# Patient Record
Sex: Female | Born: 1995 | Race: White | Hispanic: No | Marital: Single | Smoking: Never smoker
Health system: Southern US, Community
[De-identification: ages and names within clinical notes are randomized; demographics above are authoritative.]

## PROBLEM LIST (undated history)

## (undated) DIAGNOSIS — R011 Cardiac murmur, unspecified: Secondary | ICD-10-CM

## (undated) DIAGNOSIS — M419 Scoliosis, unspecified: Secondary | ICD-10-CM

## (undated) DIAGNOSIS — R9431 Abnormal electrocardiogram [ECG] [EKG]: Secondary | ICD-10-CM

## (undated) DIAGNOSIS — D649 Anemia, unspecified: Secondary | ICD-10-CM

## (undated) DIAGNOSIS — E282 Polycystic ovarian syndrome: Secondary | ICD-10-CM

## (undated) DIAGNOSIS — I4510 Unspecified right bundle-branch block: Secondary | ICD-10-CM

## (undated) HISTORY — PX: ESOPHAGUS SURGERY: SHX626

## (undated) HISTORY — DX: Unspecified right bundle-branch block: I45.10

---
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
## 2018-01-21 DIAGNOSIS — M419 Scoliosis, unspecified: Secondary | ICD-10-CM | POA: Diagnosis not present

## 2018-01-21 DIAGNOSIS — R102 Pelvic and perineal pain: Secondary | ICD-10-CM | POA: Diagnosis not present

## 2018-01-21 DIAGNOSIS — I4510 Unspecified right bundle-branch block: Secondary | ICD-10-CM | POA: Diagnosis not present

## 2018-01-21 DIAGNOSIS — B9689 Other specified bacterial agents as the cause of diseases classified elsewhere: Secondary | ICD-10-CM | POA: Diagnosis not present

## 2018-01-21 DIAGNOSIS — N912 Amenorrhea, unspecified: Secondary | ICD-10-CM | POA: Diagnosis not present

## 2018-01-21 DIAGNOSIS — N838 Other noninflammatory disorders of ovary, fallopian tube and broad ligament: Secondary | ICD-10-CM | POA: Diagnosis not present

## 2018-01-21 DIAGNOSIS — N760 Acute vaginitis: Secondary | ICD-10-CM | POA: Diagnosis not present

## 2018-01-21 DIAGNOSIS — R109 Unspecified abdominal pain: Secondary | ICD-10-CM | POA: Diagnosis not present

## 2018-03-25 ENCOUNTER — Encounter: Payer: Self-pay | Admitting: Emergency Medicine

## 2018-03-25 ENCOUNTER — Other Ambulatory Visit: Payer: Self-pay

## 2018-03-25 ENCOUNTER — Ambulatory Visit
Admission: EM | Admit: 2018-03-25 | Discharge: 2018-03-25 | Disposition: A | Discharge status: Home / Self Care | Payer: Federal, State, Local not specified - PPO | Attending: Family Medicine | Admitting: Family Medicine

## 2018-03-25 DIAGNOSIS — L02411 Cutaneous abscess of right axilla: Secondary | ICD-10-CM | POA: Diagnosis not present

## 2018-03-25 MED ORDER — MUPIROCIN 2 % EX OINT: 1.0000 "application " | TOPICAL_OINTMENT | Freq: Three times a day (TID) | CUTANEOUS | 0 refills | Status: DC

## 2018-03-25 MED ORDER — DOXYCYCLINE HYCLATE 100 MG PO CAPS: 100.0000 mg | ORAL_CAPSULE | Freq: Two times a day (BID) | ORAL | 0 refills | Status: DC

## 2018-03-25 NOTE — ED Triage Notes (Signed)
Patient in today c/o boil in her right arm pit x 5 days.

## 2018-03-25 NOTE — ED Provider Notes (Signed)
[facility]    CSN: [id number] Arrival date & time: 03/25/18  1742     History   Chief Complaint Chief Complaint  Patient presents with  . Recurrent Skin Infections    right underarm    HPI Holly Wu is a 22 y.o. female.   HPI  22 year old female presents with a abscess in her right axilla  for 5 days.  It is not improved and only worsened.  She has had no fever chills.  There is been some mild drainage from the area.  Is in the area that she shaves.         Past Medical History:  Diagnosis Date  . EKG abnormalities   . Scoliosis     There are no active problems to display for this patient.   Past Surgical History:  Procedure Laterality Date  . ESOPHAGUS SURGERY     foreign object removed    OB History   None      Home Medications    Prior to Admission medications   Medication Sig Start Date End Date Taking? Authorizing Provider  doxycycline (VIBRAMYCIN) 100 MG capsule Take 1 capsule (100 mg total) by mouth 2 (two) times daily. 03/25/18   Lutricia Feil, PA-C  mupirocin ointment (BACTROBAN) 2 % Apply 1 application topically 3 (three) times daily. 03/25/18   Lutricia Feil, PA-C    Family History Family History  Problem Relation Age of Onset  . Diabetes Mother   . Healthy Father     Social History Social History   Tobacco Use  . Smoking status: Never Smoker  . Smokeless tobacco: Never Used  Substance Use Topics  . Alcohol use: No  . Drug use: No     Allergies   Patient has no known allergies.   Review of Systems Review of Systems  Constitutional: Positive for activity change. Negative for chills, fatigue and fever.  Skin: Positive for color change and wound.  All other systems reviewed and are negative.    Physical Exam Triage Vital Signs ED Triage Vitals [03/25/18 1757]  Enc Vitals Group     BP 121/87     Pulse Rate 88     Resp 16     Temp 98.3 F (36.8 C)     Temp Source Oral     SpO2 100 %       Weight 153 lb (69.4 kg)     Height  (1.575 m)     Head Circumference      Peak Flow      Pain Score 8     Pain Loc      Pain Edu?      Excl. in GC?    No data found.  Updated Vital Signs BP 121/87 (BP Location: Left Arm)   Pulse 88   Temp 98.3 F (36.8 C) (Oral)   Resp 16   Ht  (1.575 m)   Wt 153 lb (69.4 kg)   LMP 01/19/2018 (Approximate)   SpO2 100%   BMI 27.98 kg/m   Visual Acuity Right Eye Distance:   Left Eye Distance:   Bilateral Distance:    Right Eye Near:   Left Eye Near:    Bilateral Near:     Physical Exam  Constitutional: She is oriented to person, place, and time. She appears well-developed and well-nourished. No distress.  HENT:  Head: Normocephalic.  Eyes: Pupils are equal, round, and reactive to light. Right eye exhibits  no discharge. Left eye exhibits no discharge.  Neck: Normal range of motion.  Musculoskeletal: Normal range of motion.  Neurological: She is alert and oriented to person, place, and time.  Skin: Skin is warm and dry. She is not diaphoretic. There is erythema.  Nation of the right upper arm just distal to the axillary fold shows a abscess pointing from approximately 1 cm with erythema and induration extending to 3 cm.  Punctate opening in the middle of the pointing area easily draining serosanguineous and purulent material with very little effort.  Psychiatric: She has a normal mood and affect. Her behavior is normal. Judgment and thought content normal.  Nursing note and vitals reviewed.    UC Treatments / Results  Labs (all labs ordered are listed, but only abnormal results are displayed) Labs Reviewed - No data to display  EKG None  Radiology No results found.  Procedures Incision and Drainage Date/Time: 03/25/2018 7:04 PM Performed by: Lutricia Feil, PA-C Authorized by: Tommie Sams, DO   Consent:    Consent obtained:  Verbal   Consent given by:  Patient   Risks discussed:  Incomplete drainage,  infection and pain   Alternatives discussed:  Alternative treatment Location:    Type:  Abscess   Size:  3cm   Location:  Upper extremity   Upper extremity location:  Arm   Arm location:  R upper arm Pre-procedure details:    Skin preparation:  Betadine Anesthesia (see MAR for exact dosages):    Anesthesia method:  Local infiltration   Local anesthetic:  Lidocaine 1% WITH epi Procedure type:    Complexity:  Complex Procedure details:    Needle aspiration: no     Incision types:  Single straight   Incision depth:  Subcutaneous   Scalpel blade:  11   Wound management:  Probed and deloculated   Drainage:  Purulent and serosanguinous   Drainage amount:  Moderate   Wound treatment:  Drain placed and wound left open   Packing materials:  1/4 in gauze   Amount 1/4":  2 Post-procedure details:    Patient tolerance of procedure:  Tolerated well, no immediate complications Comments:     Keep dry for 48 hours.  Return to our clinic in 48 hours for wound check possibly removal of drain and reapplication.   (including critical care time)  Medications Ordered in UC Medications - No data to display  Initial Impression / Assessment and Plan / UC Course  I have reviewed the triage vital signs and the nursing notes.  Pertinent labs & imaging results that were available during my care of the patient were reviewed by me and considered in my medical decision making (see chart for details).     Plan: 1. Test/x-ray results and diagnosis reviewed with patient 2. rx as per orders; risks, benefits, potential side effects reviewed with patient 3. Recommend supportive treatment with an area dry follow-up in 2 days for a wound check 4. F/u prn if symptoms worsen or don't improve  Final Clinical Impressions(s) / UC Diagnoses   Final diagnoses:  Cutaneous abscess of right axilla     Discharge Instructions      Keep Area dry for 2 days.  Return to our clinic in 2 days for wound check and  removal of the packing material and if develop high fevers or are not improving  return to the clinic sooner.   ED Prescriptions    Medication Sig Dispense Auth. Provider  mupirocin ointment (BACTROBAN) 2 % Apply 1 application topically 3 (three) times daily. 22 g Ovid Curd P, PA-C   doxycycline (VIBRAMYCIN) 100 MG capsule Take 1 capsule (100 mg total) by mouth 2 (two) times daily. 14 capsule Lutricia Feil, PA-C     Controlled Substance Prescriptions [place] Controlled Substance Registry consulted? Not Applicable   Lutricia Feil, PA-C 03/25/18 6578

## 2018-03-25 NOTE — Discharge Instructions (Signed)
Keep Area dry for 2 days.  Return to our clinic in 2 days for wound check and removal of the packing material and if develop high fevers or are not improving  return to the clinic sooner.

## 2018-03-27 ENCOUNTER — Ambulatory Visit
Admission: EM | Admit: 2018-03-27 | Discharge: 2018-03-27 | Disposition: A | Discharge status: Home / Self Care | Payer: Federal, State, Local not specified - PPO | Attending: Family Medicine | Admitting: Family Medicine

## 2018-03-27 ENCOUNTER — Encounter: Payer: Self-pay | Admitting: Emergency Medicine

## 2018-03-27 ENCOUNTER — Other Ambulatory Visit: Payer: Self-pay

## 2018-03-27 DIAGNOSIS — Z5189 Encounter for other specified aftercare: Secondary | ICD-10-CM

## 2018-03-27 NOTE — ED Provider Notes (Signed)
[facility]  CSN: [id number] Arrival date & time: 03/27/18  1908  History   Chief Complaint Chief Complaint  Patient presents with  . Follow-up    abscess   HPI  22 year old female presents for wound check.  Patient had incision and drainage of an abscess of her right axilla on 5/8.  Wound was packed.  She was placed on doxycycline.  She presents today for packing removal and reassessment.  Patient states that she is markedly improved.  No fevers or chills.  No surrounding redness.  Compliant with antibiotic.  Past Medical History:  Diagnosis Date  . EKG abnormalities   . Scoliosis    Past Surgical History:  Procedure Laterality Date  . ESOPHAGUS SURGERY     foreign object removed    OB History   None      Home Medications    Prior to Admission medications   Medication Sig Start Date End Date Taking? Authorizing Provider  doxycycline (VIBRAMYCIN) 100 MG capsule Take 1 capsule (100 mg total) by mouth 2 (two) times daily. 03/25/18  Yes Lutricia Feil, PA-C  mupirocin ointment (BACTROBAN) 2 % Apply 1 application topically 3 (three) times daily. 03/25/18   Lutricia Feil, PA-C    Family History Family History  Problem Relation Age of Onset  . Diabetes Mother   . Healthy Father     Social History Social History   Tobacco Use  . Smoking status: Never Smoker  . Smokeless tobacco: Never Used  Substance Use Topics  . Alcohol use: No  . Drug use: No    Allergies   Patient has no known allergies.  Review of Systems Review of Systems  Constitutional: Negative.   Skin: Positive for wound.   Physical Exam Triage Vital Signs ED Triage Vitals  Enc Vitals Group     BP 03/27/18 1915 118/80     Pulse Rate 03/27/18 1915 85     Resp 03/27/18 1915 16     Temp 03/27/18 1915 98.6 F (37 C)     Temp Source 03/27/18 1915 Oral     SpO2 03/27/18 1915 100 %     Weight 03/27/18 1916 153 lb (69.4 kg)     Height 03/27/18 1916  (1.575 m)     Head  Circumference --      Peak Flow --      Pain Score 03/27/18 1916 1     Pain Loc --      Pain Edu? --      Excl. in GC? --   Updated Vital Signs BP 118/80 (BP Location: Left Arm)   Pulse 85   Temp 98.6 F (37 C) (Oral)   Resp 16   Ht  (1.575 m)   Wt 153 lb (69.4 kg)   SpO2 100%   BMI 27.98 kg/m   Physical Exam  Constitutional: She appears well-developed. No distress.  Pulmonary/Chest: Effort normal. No respiratory distress.  Skin:  Right axilla - Packing removed. No erythema. No purulence currently.  Nursing note and vitals reviewed.  UC Treatments / Results  Labs (all labs ordered are listed, but only abnormal results are displayed) Labs Reviewed - No data to display  EKG None  Radiology No results found.  Procedures Procedures (including critical care time)  Medications Ordered in UC Medications - No data to display  Initial Impression / Assessment and Plan / UC Course  I have reviewed the triage vital signs and the nursing notes.  Pertinent labs & imaging results that were available during my care of the patient were reviewed by me and considered in my medical decision making (see chart for details).    22 year old female presents for wound check.  Packing removed.  No indication for additional packing.  Advised completion of antibiotic.  Supportive care.  Final Clinical Impressions(s) / UC Diagnoses   Final diagnoses:  Wound check, abscess     Discharge Instructions     Finish antibiotic.  Keep covered.  Take care  Dr. Adriana Simas    ED Prescriptions    None     Controlled Substance Prescriptions [place] Controlled Substance Registry consulted? Not Applicable   Tommie Sams, DO 03/27/18 0981

## 2018-03-27 NOTE — ED Triage Notes (Signed)
Patient in today for follow up of abscess in her right under arm that was lanced Wednesday (03/25/18). Patient states she is feeling much better.

## 2018-03-27 NOTE — Discharge Instructions (Signed)
Finish antibiotic.  Keep covered.  Take care  Dr. Adriana Simas

## 2018-07-13 ENCOUNTER — Encounter: Payer: Self-pay | Admitting: Obstetrics and Gynecology

## 2018-07-13 ENCOUNTER — Ambulatory Visit (INDEPENDENT_AMBULATORY_CARE_PROVIDER_SITE_OTHER): Payer: Federal, State, Local not specified - PPO | Admitting: Obstetrics and Gynecology

## 2018-07-13 VITALS — BP 110/76 | HR 93 | Ht 62.0 in | Wt 185.0 lb

## 2018-07-13 DIAGNOSIS — N914 Secondary oligomenorrhea: Secondary | ICD-10-CM | POA: Diagnosis not present

## 2018-07-13 DIAGNOSIS — E282 Polycystic ovarian syndrome: Secondary | ICD-10-CM | POA: Insufficient documentation

## 2018-07-13 DIAGNOSIS — E288 Other ovarian dysfunction: Secondary | ICD-10-CM | POA: Diagnosis not present

## 2018-07-13 NOTE — Progress Notes (Signed)
Obstetrics & Gynecology Office Visit   Chief Complaint  Patient presents with  . Follow-up    pt had u/s 01/21/18 and was informed of enlarged ovary, Denies abdominal pain,dysuria   History of Present Illness: 22 y.o. G0 who presents after [facility].  She had a workup which included a negative STD screen, negative UA.  She had a pelvic ultrasound that showed enlarged ovaries with follicles, possibly consistent with PCOS.  She was diagnosed and treated for BV.  She went for 1.5 years with no menses and then had one in June.  That period lasted 5 days. There were some clots on the first day. The menses was painful. She has never taken contraception. Menarche was age 22-13. About two years ago her menses started spreading out.  Prior to that she had a menses every month. She has never tried to get pregnant.  She has hair growth above her lip and on her stomach area.  She has areas of acne on her face, sometimes on her chest, and on her back.  She denies a history of STDs. She states that she has gained about 50 pounds in two years.   Past Medical History:  Diagnosis Date  . EKG abnormalities   . Right bundle branch block 2017  . Scoliosis    Past Surgical History:  Procedure Laterality Date  . ESOPHAGUS SURGERY     foreign object removed    Gynecologic History: Patient's last menstrual period was 04/23/2018.  Obstetric History: G0  Family History  Problem Relation Age of Onset  . Diabetes Mother   . Healthy Father   . Cancer Maternal Grandmother   . Cancer Maternal Grandfather     Social History   Socioeconomic History  . Marital status: Single    Spouse name: Not on file  . Number of children: Not on file  . Years of education: Not on file  . Highest education level: Not on file  Occupational History  . Not on file  Social Needs  . Financial resource strain: Not on file  . Food insecurity:    Worry: Not on file    Inability: Not on file    . Transportation needs:    Medical: Not on file    Non-medical: Not on file  Tobacco Use  . Smoking status: Never Smoker  . Smokeless tobacco: Never Used  Substance and Sexual Activity  . Alcohol use: No  . Drug use: No  . Sexual activity: Not on file  Lifestyle  . Physical activity:    Days per week: Not on file    Minutes per session: Not on file  . Stress: Not on file  Relationships  . Social connections:    Talks on phone: Not on file    Gets together: Not on file    Attends religious service: Not on file    Active member of club or organization: Not on file    Attends meetings of clubs or organizations: Not on file    Relationship status: Not on file  . Intimate partner violence:    Fear of current or ex partner: Not on file    Emotionally abused: Not on file    Physically abused: Not on file    Forced sexual activity: Not on file  Other Topics Concern  . Not on file  Social History Narrative  . Not on file   Allergies: No Known Allergies  Prior to  Admission medications   Medication Sig Start Date End Date Taking? Authorizing Provider  mupirocin ointment (BACTROBAN) 2 % Apply 1 application topically 3 (three) times daily. Patient not taking: Reported on 07/13/2018 03/25/18   Lutricia Feil, PA-C   Review of Systems  Constitutional: Negative.   HENT: Negative.   Eyes: Negative.   Respiratory: Negative.   Cardiovascular: Negative.   Gastrointestinal: Negative.   Genitourinary: Negative.   Musculoskeletal: Negative.   Skin: Negative.   Neurological: Negative.   Psychiatric/Behavioral: Negative.     Physical Exam BP 110/76   Pulse 93   Ht 5\' 2"  (1.575 m)   Wt 185 lb (83.9 kg)   LMP 04/23/2018   SpO2 99%   BMI 33.84 kg/m  Patient's last menstrual period was 04/23/2018. Physical Exam  Constitutional: She is oriented to person, place, and time. She appears well-developed and well-nourished. No distress.  HENT:  Mild acanthosis nigricans on posterior  neck  Eyes: EOM are normal. No scleral icterus.  Neck: Normal range of motion. Neck supple.  Cardiovascular: Normal rate and regular rhythm.  Pulmonary/Chest: Effort normal and breath sounds normal. No respiratory distress. She has no wheezes. She has no rales.  Abdominal: Soft. Bowel sounds are normal. She exhibits no distension and no mass. There is no tenderness. There is no rebound and no guarding.  Musculoskeletal: Normal range of motion. She exhibits no edema.  Neurological: She is alert and oriented to person, place, and time. No cranial nerve deficit.  Skin: Skin is warm and dry. No erythema.  Mild terminal hair above lip and on chin.  Also, terminal hair noted (shaved) around umbilicus  Psychiatric: She has a normal mood and affect. Her behavior is normal. Judgment normal.   Assessment: 22 y.o. No obstetric history on file. female here for  1. Secondary oligomenorrhea   2. Hyperandrogenism      Plan: Problem List Items Addressed This Visit      Endocrine   Hyperandrogenism   Relevant Orders   17-Hydroxyprogesterone   DHEA-sulfate   Hgb A1c w/o eAG   Prolactin   Testosterone,Free and Total   TSH + free T4   Lipid Panel With LDL/HDL Ratio     Other   Secondary oligomenorrhea - Primary   Relevant Orders   17-Hydroxyprogesterone   DHEA-sulfate   Hgb A1c w/o eAG   Prolactin   Testosterone,Free and Total   TSH + free T4   Lipid Panel With LDL/HDL Ratio     Reviewed findings from [facility] ER. Discussed findings on exam today. Discussed PCOS as a diagnosis of exclusion.  Will get labs to exclude other potential causes. Discussed potential treatment options for PCOS, if that is her diagnosis.  30 minutes spent in face to face discussion with > 50% spent in counseling,management, and coordination of care of her secondary oligomenorrhea and hyperandrogenism.   Thomasene Mohair, MD 07/14/2018 12:54 PM

## 2018-07-14 ENCOUNTER — Encounter: Payer: Self-pay | Admitting: Obstetrics and Gynecology

## 2018-07-15 LAB — TSH+FREE T4
Free T4: 1.22 ng/dL (ref 0.93–1.60)
TSH: 4.21 u[IU]/mL (ref 0.450–4.500)

## 2018-07-15 LAB — TESTOSTERONE,FREE AND TOTAL
TESTOSTERONE FREE: 2 pg/mL
TESTOSTERONE: 66 ng/dL

## 2018-07-15 LAB — LIPID PANEL WITH LDL/HDL RATIO
Cholesterol, Total: 185 mg/dL — ABNORMAL HIGH (ref 100–169)
HDL: 41 mg/dL (ref >39)
LDL CALC: 122 mg/dL — AB (ref 0–109)
LDL/HDL RATIO: 3 ratio (ref 0.0–3.2)
Triglycerides: 109 mg/dL — ABNORMAL HIGH (ref 0–89)
VLDL Cholesterol Cal: 22 mg/dL (ref 5–40)

## 2018-07-15 LAB — 17-HYDROXYPROGESTERONE: 17-Hydroxyprogesterone: 265 ng/dL

## 2018-07-15 LAB — HGB A1C W/O EAG: HEMOGLOBIN A1C: 5.3 % (ref 4.8–5.6)

## 2018-07-15 LAB — PROLACTIN: Prolactin: 24.7 ng/mL — ABNORMAL HIGH (ref 4.8–23.3)

## 2018-07-15 LAB — DHEA-SULFATE: DHEA SO4: 136.2 ug/dL (ref 110.0–433.2)

## 2018-07-21 ENCOUNTER — Other Ambulatory Visit: Payer: Self-pay | Admitting: Obstetrics and Gynecology

## 2018-07-21 DIAGNOSIS — E282 Polycystic ovarian syndrome: Secondary | ICD-10-CM

## 2018-07-21 MED ORDER — NORGESTIMATE-ETH ESTRADIOL 0.25-35 MG-MCG PO TABS: 1.0000 | ORAL_TABLET | Freq: Every day | ORAL | 1 refills | Status: DC

## 2018-07-24 ENCOUNTER — Telehealth: Payer: Self-pay | Admitting: Obstetrics and Gynecology

## 2018-07-24 NOTE — Telephone Encounter (Signed)
I've already released all her lab results to her with comments

## 2018-07-24 NOTE — Telephone Encounter (Signed)
Patient is calling for labs results. Please advise. 

## 2018-07-24 NOTE — Telephone Encounter (Signed)
Called and left voice mail for patient to call back to be schedule °

## 2018-07-28 ENCOUNTER — Ambulatory Visit (INDEPENDENT_AMBULATORY_CARE_PROVIDER_SITE_OTHER): Payer: Federal, State, Local not specified - PPO | Admitting: Obstetrics and Gynecology

## 2018-07-28 ENCOUNTER — Encounter: Payer: Self-pay | Admitting: Obstetrics and Gynecology

## 2018-07-28 VITALS — BP 118/74 | Ht 62.0 in | Wt 188.0 lb

## 2018-07-28 DIAGNOSIS — E669 Obesity, unspecified: Secondary | ICD-10-CM | POA: Diagnosis not present

## 2018-07-28 NOTE — Patient Instructions (Signed)
Options:  Belviq Saxenda Orlistat Xenical Contrave

## 2018-07-28 NOTE — Progress Notes (Signed)
Obstetrics & Gynecology Office Visit   Chief Complaint  Patient presents with  . Follow-up    Weight loss   History of Present Illness: Patientis a 22 y.o. G0P0000 female, who presents for the evaluation of weight gain. She has gained 50 pounds primarily over two years. The patient states the following issues have contributed to her weight problem: sedentary lifestyle.  The patient has no additional symptoms. The patient specifically denies memory loss, muscle weakness, excessive thirst, and polyuria. Weight related co-morbidities include PCOS, RBBB. The patient's past medical history is notable for RBBB and PCOS. She has tried no medication interventions in the past.   Past Medical History:  Diagnosis Date  . EKG abnormalities   . Right bundle branch block 2017  . Scoliosis    Past Surgical History:  Procedure Laterality Date  . ESOPHAGUS SURGERY     foreign object removed   Gynecologic History: Patient's last menstrual period was 05/05/2018.  Obstetric History: G0P0000  Family History  Problem Relation Age of Onset  . Diabetes Mother   . Healthy Father   . Cancer Maternal Grandmother   . Cancer Maternal Grandfather    Social History   Socioeconomic History  . Marital status: Single    Spouse name: Not on file  . Number of children: Not on file  . Years of education: Not on file  . Highest education level: Not on file  Occupational History  . Not on file  Social Needs  . Financial resource strain: Not on file  . Food insecurity:    Worry: Not on file    Inability: Not on file  . Transportation needs:    Medical: Not on file    Non-medical: Not on file  Tobacco Use  . Smoking status: Never Smoker  . Smokeless tobacco: Never Used  Substance and Sexual Activity  . Alcohol use: No  . Drug use: No  . Sexual activity: Not on file  Lifestyle  . Physical activity:    Days per week: Not on file    Minutes per session: Not on file  . Stress: Not on file    Relationships  . Social connections:    Talks on phone: Not on file    Gets together: Not on file    Attends religious service: Not on file    Active member of club or organization: Not on file    Attends meetings of clubs or organizations: Not on file    Relationship status: Not on file  . Intimate partner violence:    Fear of current or ex partner: Not on file    Emotionally abused: Not on file    Physically abused: Not on file    Forced sexual activity: Not on file  Other Topics Concern  . Not on file  Social History Narrative  . Not on file    No Known Allergies  Prior to Admission medications   Medication Sig Start Date End Date Taking? Authorizing Provider  mupirocin ointment (BACTROBAN) 2 % Apply 1 application topically 3 (three) times daily. Patient not taking: Reported on 07/13/2018 03/25/18   Lutricia Feil, PA-C  norgestimate-ethinyl estradiol (SPRINTEC 28) 0.25-35 MG-MCG tablet Take 1 tablet by mouth daily. 07/21/18   Conard Novak, MD   Review of Systems  Constitutional: Negative.   HENT: Negative.   Eyes: Negative.   Respiratory: Negative.   Cardiovascular: Negative.   Gastrointestinal: Negative.   Genitourinary: Negative.   Musculoskeletal: Negative.   Skin:  Negative.   Neurological: Negative.   Psychiatric/Behavioral: Negative.      Physical Exam BP 118/74   Ht 5\' 2"  (1.575 m)   Wt 188 lb (85.3 kg)   LMP 05/05/2018   BMI 34.39 kg/m  Patient's last menstrual period was 05/05/2018. Physical Exam  Constitutional: She is oriented to person, place, and time. She appears well-developed and well-nourished. No distress.  HENT:  Head: Normocephalic and atraumatic.  Eyes: Conjunctivae are normal. No scleral icterus.  Pulmonary/Chest: No respiratory distress.  Musculoskeletal: Normal range of motion. She exhibits no edema.  Neurological: She is alert and oriented to person, place, and time. No cranial nerve deficit.  Psychiatric: She has a normal mood  and affect. Her behavior is normal. Judgment normal.   Assessment: 22 y.o. G0P0000 female here for  1. Obesity (BMI 30.0-34.9)      Plan: Problem List Items Addressed This Visit      Other   Obesity (BMI 30.0-34.9) - Primary     1) 1500 Calorie ADA Diet  2) Patient education given regarding appropriate lifestyle changes for weight loss including: regular physical activity, healthy coping strategies, caloric restriction and healthy eating patterns.  3) Patient will be started on weight loss medication. The risks and benefits and side effects of medication, such as Adipex (Phenteramine) ,  Tenuate (Diethylproprion), Belviq (lorcarsin), Contrave (buproprion/naltrexone), Qsymia (phentermine/topiramate), and Saxenda (liraglutide) is discussed. The pros and cons of suppressing appetite and boosting metabolism is discussed. Risks of tolerence and addiction is discussed for selected agents discussed. Use of medicine will ne short term, such as 3-4 months at a time followed by a period of time off of the medicine to avoid these risks and side effects for Adipex, Qsymia, and Tenuate discussed.  I recommended against starting phentermine and diethylpropion.  Options stressed today were; Belviq, Saxenda, Orlistat, Xenical, and Contrave. She will contact her insurance to see if any of these are covered and will let me know.  I do not feel comfortable with phentermine, diethylpropion, and Qsymia given her heart issue.  4) Comorbidity Screening - hypothyroidism screening, diabetes, and hyperlipidemia screening offered  5) Encouraged weekly weight monitorig to track progress and sample 1 week food diary  6) Contraception - discussed that all weight loss drugs fall in to pregnancy category X, patient currently has reliable contraception in the form of combined OCPs  7) Follow up based on medication choice and appropriate timing to measure response.   25 minutes spent in face to face discussion with > 50%  spent in counseling,management, and coordination of care of her obesity.   Thomasene Mohair, MD 07/30/2018 10:20 AM

## 2018-07-30 ENCOUNTER — Encounter: Payer: Self-pay | Admitting: Obstetrics and Gynecology

## 2018-07-30 DIAGNOSIS — E669 Obesity, unspecified: Secondary | ICD-10-CM | POA: Insufficient documentation

## 2018-08-12 NOTE — Telephone Encounter (Signed)
Pt inquiring about previous my chart request for rx of Saxenda.

## 2018-08-17 ENCOUNTER — Other Ambulatory Visit: Payer: Self-pay | Admitting: Obstetrics and Gynecology

## 2018-08-17 DIAGNOSIS — E669 Obesity, unspecified: Secondary | ICD-10-CM

## 2018-08-17 MED ORDER — LIRAGLUTIDE -WEIGHT MANAGEMENT 18 MG/3ML ~~LOC~~ SOPN: PEN_INJECTOR | SUBCUTANEOUS | 0 refills | Status: AC

## 2018-08-17 MED ORDER — INSULIN PEN NEEDLE 32G X 6 MM MISC: 3 refills | Status: DC

## 2018-08-17 MED ORDER — LIRAGLUTIDE -WEIGHT MANAGEMENT 18 MG/3ML ~~LOC~~ SOPN: 3.0000 mg | PEN_INJECTOR | Freq: Every day | SUBCUTANEOUS | 2 refills | Status: DC

## 2018-09-15 NOTE — Telephone Encounter (Signed)
Please advise 

## 2018-09-28 ENCOUNTER — Other Ambulatory Visit: Payer: Self-pay | Admitting: Obstetrics and Gynecology

## 2018-09-28 DIAGNOSIS — E669 Obesity, unspecified: Secondary | ICD-10-CM

## 2018-09-28 MED ORDER — PHENTERMINE HCL 37.5 MG PO TABS: 18.7500 mg | ORAL_TABLET | Freq: Every day | ORAL | 0 refills | Status: DC

## 2018-10-28 ENCOUNTER — Other Ambulatory Visit: Payer: Self-pay | Admitting: Obstetrics and Gynecology

## 2018-10-28 DIAGNOSIS — E669 Obesity, unspecified: Secondary | ICD-10-CM

## 2018-10-28 MED ORDER — PHENTERMINE HCL 37.5 MG PO TABS: 37.5000 mg | ORAL_TABLET | Freq: Every day | ORAL | 0 refills | Status: DC

## 2018-11-25 ENCOUNTER — Other Ambulatory Visit: Payer: Self-pay | Admitting: Obstetrics and Gynecology

## 2018-11-25 DIAGNOSIS — E66811 Obesity, class 1: Secondary | ICD-10-CM

## 2018-11-25 DIAGNOSIS — E669 Obesity, unspecified: Secondary | ICD-10-CM

## 2018-12-03 ENCOUNTER — Ambulatory Visit
Admission: EM | Admit: 2018-12-03 | Discharge: 2018-12-03 | Disposition: A | Discharge status: Home / Self Care | Payer: Federal, State, Local not specified - PPO | Attending: Family Medicine | Admitting: Family Medicine

## 2018-12-03 ENCOUNTER — Other Ambulatory Visit: Payer: Self-pay

## 2018-12-03 DIAGNOSIS — R109 Unspecified abdominal pain: Secondary | ICD-10-CM | POA: Diagnosis not present

## 2018-12-03 DIAGNOSIS — R1110 Vomiting, unspecified: Secondary | ICD-10-CM | POA: Diagnosis not present

## 2018-12-03 DIAGNOSIS — N12 Tubulo-interstitial nephritis, not specified as acute or chronic: Secondary | ICD-10-CM | POA: Diagnosis not present

## 2018-12-03 DIAGNOSIS — R112 Nausea with vomiting, unspecified: Secondary | ICD-10-CM | POA: Diagnosis not present

## 2018-12-03 DIAGNOSIS — R300 Dysuria: Secondary | ICD-10-CM | POA: Diagnosis not present

## 2018-12-03 DIAGNOSIS — R509 Fever, unspecified: Secondary | ICD-10-CM | POA: Diagnosis not present

## 2018-12-03 HISTORY — DX: Polycystic ovarian syndrome: E28.2

## 2018-12-03 LAB — URINALYSIS, COMPLETE (UACMP) WITH MICROSCOPIC
BILIRUBIN URINE: NEGATIVE
Glucose, UA: NEGATIVE mg/dL
Ketones, ur: NEGATIVE mg/dL
NITRITE: POSITIVE — AB
Protein, ur: >300 mg/dL — AB
SPECIFIC GRAVITY, URINE: 1.025 (ref 1.005–1.030)
WBC, UA: >50 WBC/hpf (ref 0–5)
pH: 5.5 (ref 5.0–8.0)

## 2018-12-03 MED ORDER — CIPROFLOXACIN HCL 500 MG PO TABS: 500.0000 mg | ORAL_TABLET | Freq: Two times a day (BID) | ORAL | 0 refills | Status: DC

## 2018-12-03 MED ORDER — ONDANSETRON 4 MG PO TBDP: 4.0000 mg | ORAL_TABLET | Freq: Three times a day (TID) | ORAL | 0 refills | Status: DC | PRN

## 2018-12-03 NOTE — ED Provider Notes (Signed)
[facility]    CSN: [id number] Arrival date & time: 12/03/18  0825  History   Chief Complaint Chief Complaint  Patient presents with  . Flank Pain  . Emesis   HPI  23 year old female presents with concerns for UTI.  Patient reports that she has had urinary symptoms for the past 1.5 weeks.  Reports dysuria, urgency, frequency.  Yesterday developed left-sided flank pain and associated nausea.  Has had one episode of vomiting today.  Her pain is currently severe, 8/10 in severity.  She has had a mildly elevated temperature.  No reports of chills.  No reports of abdominal pain.  No other associated symptoms.  She has taken Azo without relief.  No other complaints.  PMH, Surgical Hx, Family Hx, Social History reviewed and updated as below.  Past Medical History:  Diagnosis Date  . EKG abnormalities   . PCOS (polycystic ovarian syndrome)   . Right bundle branch block 2017  . Scoliosis     Patient Active Problem List   Diagnosis Date Noted  . Obesity (BMI 30.0-34.9) 07/30/2018  . Secondary oligomenorrhea 07/13/2018  . Polycystic ovary syndrome 07/13/2018    Past Surgical History:  Procedure Laterality Date  . ESOPHAGUS SURGERY     foreign object removed    OB History    Gravida  0   Para  0   Term  0   Preterm  0   AB  0   Living  0     SAB  0   TAB  0   Ectopic  0   Multiple  0   Live Births  0            Home Medications    Prior to Admission medications   Medication Sig Start Date End Date Taking? Authorizing Provider  ciprofloxacin (CIPRO) 500 MG tablet Take 1 tablet (500 mg total) by mouth 2 (two) times daily. 12/03/18   Tommie Samsook, Mimie Goering G, DO  norgestimate-ethinyl estradiol (SPRINTEC 28) 0.25-35 MG-MCG tablet Take 1 tablet by mouth daily. 07/21/18   Conard NovakJackson, Stephen D, MD  ondansetron (ZOFRAN-ODT) 4 MG disintegrating tablet Take 1 tablet (4 mg total) by mouth every 8 (eight) hours as needed for nausea or vomiting. 12/03/18   Tommie Samsook, Brea Coleson  G, DO    Family History Family History  Problem Relation Age of Onset  . Diabetes Mother   . Healthy Father   . Cancer Maternal Grandmother   . Cancer Maternal Grandfather     Social History Social History   Tobacco Use  . Smoking status: Never Smoker  . Smokeless tobacco: Never Used  Substance Use Topics  . Alcohol use: No  . Drug use: No     Allergies   Patient has no known allergies.   Review of Systems Review of Systems  Gastrointestinal: Positive for nausea and vomiting.  Genitourinary: Positive for dysuria, flank pain, frequency and urgency.   Physical Exam Triage Vital Signs ED Triage Vitals  Enc Vitals Group     BP 12/03/18 0838 104/68     Pulse Rate 12/03/18 0838 (!) 110     Resp 12/03/18 0838 18     Temp 12/03/18 0838 99 F (37.2 C)     Temp Source 12/03/18 0838 Oral     SpO2 12/03/18 0838 98 %     Weight 12/03/18 0840 176 lb (79.8 kg)     Height 12/03/18 0840 5\' 2"  (1.575 m)     Head Circumference --  Peak Flow --      Pain Score 12/03/18 0840 8     Pain Loc --      Pain Edu? --      Excl. in GC? --    Updated Vital Signs BP 104/68 (BP Location: Left Arm)   Pulse (!) 110   Temp 99 F (37.2 C) (Oral)   Resp 18   Ht 5\' 2"  (1.575 m)   Wt 79.8 kg   LMP 11/05/2018   SpO2 98%   BMI 32.19 kg/m   Visual Acuity Right Eye Distance:   Left Eye Distance:   Bilateral Distance:    Right Eye Near:   Left Eye Near:    Bilateral Near:     Physical Exam Vitals signs and nursing note reviewed.  Constitutional:      General: She is not in acute distress. HENT:     Head: Normocephalic and atraumatic.     Nose: Nose normal.     Mouth/Throat:     Mouth: Mucous membranes are moist.     Pharynx: Oropharynx is clear.  Eyes:     General:        Right eye: No discharge.        Left eye: No discharge.     Conjunctiva/sclera: Conjunctivae normal.  Cardiovascular:     Rate and Rhythm: Regular rhythm. Tachycardia present.  Pulmonary:      Effort: Pulmonary effort is normal.     Breath sounds: No wheezing, rhonchi or rales.  Abdominal:     General: There is no distension.     Palpations: Abdomen is soft.     Tenderness: There is left CVA tenderness.  Neurological:     Mental Status: She is alert.  Psychiatric:        Mood and Affect: Mood normal.        Behavior: Behavior normal.    UC Treatments / Results  Labs (all labs ordered are listed, but only abnormal results are displayed) Labs Reviewed  URINALYSIS, COMPLETE (UACMP) WITH MICROSCOPIC - Abnormal; Notable for the following components:      Result Value   APPearance CLOUDY (*)    Hgb urine dipstick MODERATE (*)    Protein, ur >300 (*)    Nitrite POSITIVE (*)    Leukocytes, UA SMALL (*)    Bacteria, UA MANY (*)    All other components within normal limits    EKG None  Radiology No results found.  Procedures Procedures (including critical care time)  Medications Ordered in UC Medications - No data to display  Initial Impression / Assessment and Plan / UC Course  I have reviewed the triage vital signs and the nursing notes.  Pertinent labs & imaging results that were available during my care of the patient were reviewed by me and considered in my medical decision making (see chart for details).     23 year old female presents with pyelonephritis. Sending culture. Treating with cipro and PRN Zofran.  Final Clinical Impressions(s) / UC Diagnoses   Final diagnoses:  Pyelonephritis   Discharge Instructions   None    ED Prescriptions    Medication Sig Dispense Auth. Provider   ciprofloxacin (CIPRO) 500 MG tablet Take 1 tablet (500 mg total) by mouth 2 (two) times daily. 20 tablet Jeneva Schweizer G, DO   ondansetron (ZOFRAN-ODT) 4 MG disintegrating tablet Take 1 tablet (4 mg total) by mouth every 8 (eight) hours as needed for nausea or vomiting. 20 tablet [place],  Verdis Frederickson, DO     Controlled Substance Prescriptions [place] Controlled Substance Registry  consulted? Not Applicable   Tommie Sams, DO 12/03/18 6045

## 2018-12-03 NOTE — ED Triage Notes (Signed)
Pt with n/v starting yesterday. Has been treated for a UTI but stopped taking her medication yesterday. Left flank pain started yesterday. Pain 8/10

## 2018-12-05 LAB — URINE CULTURE: Culture: >=100000 — AB

## 2018-12-07 ENCOUNTER — Telehealth (HOSPITAL_COMMUNITY): Payer: Self-pay | Admitting: Emergency Medicine

## 2018-12-07 NOTE — Telephone Encounter (Signed)
Urine culture was positive for ESCHERICHIA COLI and was given  ciprofloxacin (CIPRO) at urgent care visit. Pt contacted and made aware, educated on completing antibiotic and to follow up if symptoms are persistent. Verbalized understanding.

## 2018-12-29 ENCOUNTER — Other Ambulatory Visit: Payer: Self-pay | Admitting: Obstetrics and Gynecology

## 2018-12-29 DIAGNOSIS — E282 Polycystic ovarian syndrome: Secondary | ICD-10-CM

## 2018-12-29 NOTE — Telephone Encounter (Signed)
It looks like she has not been seen since 07/2018, but he has put orders in 10/2018. Please advise

## 2019-01-05 ENCOUNTER — Other Ambulatory Visit: Payer: Self-pay | Admitting: Obstetrics and Gynecology

## 2019-01-05 DIAGNOSIS — E669 Obesity, unspecified: Secondary | ICD-10-CM

## 2019-02-18 ENCOUNTER — Other Ambulatory Visit: Payer: Federal, State, Local not specified - PPO

## 2019-02-18 NOTE — Telephone Encounter (Signed)
Patient is schedule 02/19/19 for nurse visit and 02/22/19 at :250 for telephone visit

## 2019-02-18 NOTE — Telephone Encounter (Signed)
It might be best to get a urine culture for her. We can do the weight management thing via phone. Maybe schedule her a phone appt? She can come in a do a urine culture and leave + a urine dip. She can come any time as far as I'm concerned.  Just send me the results.

## 2019-02-19 ENCOUNTER — Other Ambulatory Visit: Payer: Self-pay | Admitting: Obstetrics and Gynecology

## 2019-02-19 ENCOUNTER — Other Ambulatory Visit: Payer: Self-pay

## 2019-02-19 ENCOUNTER — Ambulatory Visit: Payer: Federal, State, Local not specified - PPO

## 2019-02-19 DIAGNOSIS — R102 Pelvic and perineal pain: Secondary | ICD-10-CM

## 2019-02-22 ENCOUNTER — Telehealth: Payer: Self-pay

## 2019-02-22 ENCOUNTER — Ambulatory Visit: Payer: Federal, State, Local not specified - PPO | Admitting: Obstetrics and Gynecology

## 2019-02-22 ENCOUNTER — Other Ambulatory Visit: Payer: Self-pay | Admitting: Obstetrics and Gynecology

## 2019-02-22 DIAGNOSIS — N3000 Acute cystitis without hematuria: Secondary | ICD-10-CM

## 2019-02-22 LAB — URINE CULTURE

## 2019-02-22 MED ORDER — SULFAMETHOXAZOLE-TRIMETHOPRIM 800-160 MG PO TABS: 1.0000 | ORAL_TABLET | Freq: Two times a day (BID) | ORAL | 0 refills | Status: AC

## 2019-02-22 NOTE — Telephone Encounter (Signed)
-----   Message from Conard Novak, MD sent at 02/22/2019 10:29 AM EDT ----- Would you mind letting her know that she does have a UTI? I have called in Bactrim to her pharmacy of record.  Thank you!

## 2019-02-22 NOTE — Progress Notes (Signed)
Would you mind letting her know that she does have a UTI? I have called in Bactrim to her pharmacy of record.  Thank you!

## 2019-02-22 NOTE — Telephone Encounter (Signed)
Pt aware of results and picking up meds

## 2019-03-05 ENCOUNTER — Other Ambulatory Visit: Payer: Self-pay | Admitting: Obstetrics and Gynecology

## 2019-03-05 DIAGNOSIS — E669 Obesity, unspecified: Secondary | ICD-10-CM

## 2019-03-27 ENCOUNTER — Telehealth: Payer: Federal, State, Local not specified - PPO | Admitting: Physician Assistant

## 2019-03-27 DIAGNOSIS — L255 Unspecified contact dermatitis due to plants, except food: Secondary | ICD-10-CM | POA: Diagnosis not present

## 2019-03-27 MED ORDER — PREDNISONE 20 MG PO TABS: ORAL_TABLET | ORAL | 0 refills | Status: AC

## 2019-03-27 NOTE — Progress Notes (Signed)
We are sorry that you are not feeing well.  Here is how we plan to help!  Based on what you have shared with me it looks like you have had an allergic reaction to the oily resin from a group of plants.  This resin is very sticky, so it easily attaches to your skin, clothing, tools equipment, and pet's fur.    This blistering rash is often called poison ivy rash although it can come from contact with the leaves, stems and roots of poison ivy, poison oak and poison sumac.  The oily resin contains urushiol (u-ROO-she-ol) that produces a skin rash on exposed skin.  The severity of the rash depends on the amount of urushiol that gets on your skin.  A section of skin with more urushiol on it may develop a rash sooner.  The rash usually develops 12-48 hours after exposure and can last two to three weeks.  Your skin must come in direct contact with the plant's oil to be affected.  Blister fluid doesn't spread the rash.  However, if you come into contact with a piece of clothing or pet fur that has urushiol on it, the rash may spread out.  You can also transfer the oil to other parts of your body with your fingers.  Often the rash looks like a straight line because of the way the plant brushes against your skin.    I have developed the following plan to treat your condition Since your rash is widespread or has resulted in a large number of blisters, I have prescribed an oral corticosteroid.  Please follow these recommendations:  I have sent a prednisone dose pack to your chosen pharmacy. Be sure to follow the instructions carefully and complete the entire prescription. You may use Benadryl or Caladryl topical lotions to sooth the itch and remember cool, not hot, showers and baths can help relieve the itching!  Place cool, wet compresses on the affected area for 15-30 minutes several times a day.  You may also take oral antihistamines, such as diphenhydramine (Benadryl, others), which may also help you sleep  better.  Watch your skin for any purulent (pus) drainage or red streaking from the site.  If this occurs, contact your provider.  You may require an antibiotic for a skin infection.  Make sure that the clothes you were wearing as well as any towels or sheets that may have come in contact with the oil (urushiol) are washed in detergent and hot water.         What can you do to prevent this rash?  Avoid the plants.  Learn how to identify poison ivy, poison oak and poison sumac in all seasons.  When hiking or engaging in other activities that might expose you to these plants, try to stay on cleared pathways.  If camping, make sure you pitch your tent in an area free of these plants.  Keep pets from running through wooded areas so that urushiol doesn't accidentally stick to their fur, which you may touch.  Remove or kill the plants.  In your yard, you can get rid of poison ivy by applying an herbicide or pulling it out of the ground, including the roots, while wearing heavy gloves.  Afterward remove the gloves and thoroughly wash them and your hands.  Don't burn poison ivy or related plants because the urushiol can be carried by smoke.  Wear protective clothing.  If needed, protect your skin by wearing socks, boots, pants, long   sleeves and vinyl gloves.  Wash your skin right away.  Washing off the oil with soap and water within 30 minutes of exposure may reduce your chances of getting a poison ivy rash.  Even washing after an hour or so can help reduce the severity of the rash.  If you walk through some poison ivy and then later touch your shoes, you may get some urushiol on your hands, which may then transfer to your face or body by touching or rubbing.  If the contaminated object isn't cleaned, the urushiol on it can still cause a skin reaction years later.    Be careful not to reuse towels after you have washed your skin.  Also carefully wash clothing in detergent and hot water to remove all traces of  the oil.  Handle contaminated clothing carefully so you don't transfer the urushiol to yourself, furniture, rugs or appliances.  Remember that pets can carry the oil on their fur and paws.  If you think your pet may be contaminated with urushiol, put on some long rubber gloves and give your pet a bath.  Finally, be careful not to burn these plants as the smoke can contain traces of the oil.  Inhaling the smoke may result in difficulty breathing. If that occurred you should see a physician as soon as possible.  See your doctor right away if:  The reaction is severe or widespread You inhaled the smoke from burning poison ivy and are having difficulty breathing Your skin continues to swell The rash affects your eyes, mouth or genitals Blisters are oozing pus You develop a fever greater than 100 F (37.8 C) The rash doesn't get better within a few weeks.  If you scratch the poison ivy rash, bacteria under your fingernails may cause the skin to become infected.  See your doctor if pus starts oozing from the blisters.  Treatment generally includes antibiotics.  Poison ivy treatments are usually limited to self-care methods.  And the rash typically goes away on its own in two to three weeks.     If the rash is widespread or results in a large number of blisters, your doctor may prescribe an oral corticosteroid, such as prednisone.  If a bacterial infection has developed at the rash site, your doctor may give you a prescription for an oral antibiotic.  MAKE SURE YOU  Understand these instructions. Will watch your condition. Will get help right away if you are not doing well or get worse.  Thank you for choosing an e-visit. Your e-visit answers were reviewed by a board certified advanced clinical practitioner to complete your personal care plan. Depending upon the condition, your plan could have included both over the counter or prescription medications.  Please review your pharmacy choice. If  there is a problem you may use MyChart messaging to have the prescription routed to another pharmacy.   Your safety is important to [place]. If you have drug allergies check your prescription carefully.  You can use MyChart to ask questions about today's visit, request a non-urgent call back, or ask for a work or school excuse for 24 hours related to this e-Visit. If it has been greater than 24 hours you will need to follow up with your provider, or enter a new e-Visit to address those concerns.   You will get an email in the next two days asking about your experience. I hope that your e-visit has been valuable and will speed your recovery Thank you for choosing  an e-visit.      ===View-only below this line===   ----- Message -----    From: Clarnce Flock    Sent: 03/27/2019  3:28 PM EDT      To: E-Visit Mailing List Subject: E-Visit Submission: Poison Ivy  E-Visit Submission: Poison Ivy --------------------------------  Question: Which best describes the location of your rash? Answer:   On multiple sites  Question: What best describes your rash? Answer:   A diffuse itching rash  Question: Were you recently outside? Answer:   Yes  Question: If YES, Were you possibly exposed to poison ivy, oak or sumac leaves? Answer:   Yes  Question: If NO, Do you have a pet that was out of doors? Answer:     Question: Has anyone in your family been out of doors and you have handled their clothes, shoes, equipment or towels? Answer:   No  Question: Have you been outside near anyone burning brush (yard debris) Answer:   No  Question: If YES, Are you abnormally wheezing or short of breath since that exposure? Answer:   No  Question: How long was it between the time you were exposed and the apppearance of the rash? Answer:   About a day  Question: How long has your rash been present? Answer:   Less than a week  Question: Have you used any over the counter medications for your  rash? Answer:   Yes  Question: Which of the following OTC Medications have you used? Loraine Leriche all that apply) Answer:   Calamine Lotion            Benadryl gel  Question: Have you developed a fever? Answer:   No  Question: Is the rash spreading? Answer:   Yes  Question: Have you scratched the rash and broken the skin or blisters? Answer:   Yes  Question: If YES, Has pus (milky purulent drainage) developed from or around the blisters? Answer:   Yes  Question: Can you tolerate oral prednisone? Answer:   Yes  Question: Do you have any other comments or concerns for your provider? Answer:     Question: Are you able to attach a photograph of the rash? Answer:   Yes  Question: Please list your medication allergies that you may have ? (If 'none' , please list as 'none') Answer:   None  Question: Please list any additional comments  Answer:   I have the rash on my eyes lids and my mouth  Question: Are you pregnant? Answer:   I am confident that I am not pregnant  Question: Are you breastfeeding? Answer:   No  A total of 5-10 minutes was spent evaluating this patients questionnaire and formulating a plan of care.

## 2019-04-04 ENCOUNTER — Other Ambulatory Visit: Payer: Self-pay | Admitting: Obstetrics and Gynecology

## 2019-04-04 DIAGNOSIS — E669 Obesity, unspecified: Secondary | ICD-10-CM

## 2019-04-05 NOTE — Telephone Encounter (Signed)
Please advise 

## 2019-05-25 ENCOUNTER — Other Ambulatory Visit: Payer: Self-pay | Admitting: Obstetrics and Gynecology

## 2019-05-25 DIAGNOSIS — E669 Obesity, unspecified: Secondary | ICD-10-CM

## 2019-06-12 ENCOUNTER — Other Ambulatory Visit: Payer: Self-pay | Admitting: Obstetrics and Gynecology

## 2019-06-12 DIAGNOSIS — E282 Polycystic ovarian syndrome: Secondary | ICD-10-CM

## 2019-10-05 ENCOUNTER — Encounter: Payer: Self-pay | Admitting: Obstetrics and Gynecology

## 2019-10-05 ENCOUNTER — Ambulatory Visit (INDEPENDENT_AMBULATORY_CARE_PROVIDER_SITE_OTHER): Payer: Federal, State, Local not specified - PPO | Admitting: Obstetrics and Gynecology

## 2019-10-05 ENCOUNTER — Other Ambulatory Visit (HOSPITAL_COMMUNITY)
Admission: RE | Admit: 2019-10-05 | Discharge: 2019-10-05 | Disposition: A | Discharge status: Home / Self Care | Payer: Federal, State, Local not specified - PPO | Source: Ambulatory Visit | Attending: Obstetrics and Gynecology | Admitting: Obstetrics and Gynecology

## 2019-10-05 ENCOUNTER — Other Ambulatory Visit: Payer: Self-pay

## 2019-10-05 VITALS — BP 102/70 | Ht 62.0 in | Wt 191.0 lb

## 2019-10-05 DIAGNOSIS — E282 Polycystic ovarian syndrome: Secondary | ICD-10-CM | POA: Diagnosis not present

## 2019-10-05 DIAGNOSIS — Z01419 Encounter for gynecological examination (general) (routine) without abnormal findings: Secondary | ICD-10-CM | POA: Diagnosis not present

## 2019-10-05 DIAGNOSIS — Z1331 Encounter for screening for depression: Secondary | ICD-10-CM

## 2019-10-05 DIAGNOSIS — Z1339 Encounter for screening examination for other mental health and behavioral disorders: Secondary | ICD-10-CM

## 2019-10-05 DIAGNOSIS — E221 Hyperprolactinemia: Secondary | ICD-10-CM | POA: Diagnosis not present

## 2019-10-05 DIAGNOSIS — Z113 Encounter for screening for infections with a predominantly sexual mode of transmission: Secondary | ICD-10-CM | POA: Diagnosis not present

## 2019-10-05 DIAGNOSIS — Z124 Encounter for screening for malignant neoplasm of cervix: Secondary | ICD-10-CM | POA: Insufficient documentation

## 2019-10-05 DIAGNOSIS — Z23 Encounter for immunization: Secondary | ICD-10-CM

## 2019-10-05 NOTE — Progress Notes (Signed)
Gynecology Annual Exam  PCP: Erick ColaceMinter, [name], MD  Chief Complaint  Patient presents with  . Gynecologic Exam    Talk about blood work from last time    History of Present Illness:  Ms. Holly FlockKaleigh McKenzie Wu is a 23 y.o. G0P0000 who LMP was No LMP recorded (lmp unknown). (Menstrual status: Irregular Periods)., presents today for her annual examination.  She stopped taking her birth control about 2 months ago. She has not had a menses since that time.  Her menses are regular every 28-30 days, lasting 4 day(s).  Dysmenorrhea mild, occurring premenstrually. She does not have intermenstrual bleeding.  She is sexually active. No issues with intercourse. .  Last Pap:  n/a Hx of STDs: none  There is a  FH of breast cancer in her maternal great grandmother and paternal grandmother. There is no FH of ovarian cancer. The patient does do self-breast exams.  Tobacco use: The patient denies current or previous tobacco use. Alcohol use: social drinker Exercise: not active  The patient wears seatbelts: yes.   The patient reports that domestic violence in her life is absent.   She has a history of PCOS.  She had a slightly elevated prolactin last year and this needs to be rechecked.  Her cholesterol (for her age) was also slightly elevated.   Past Medical History:  Diagnosis Date  . EKG abnormalities   . PCOS (polycystic ovarian syndrome)   . Right bundle branch block 2017  . Scoliosis     Past Surgical History:  Procedure Laterality Date  . ESOPHAGUS SURGERY     foreign object removed    Prior to Admission medications   Medication Sig Start Date End Date Taking? Authorizing Provider  SPRINTEC 28 0.25-35 MG-MCG tablet TAKE 1 TABLET BY MOUTH EVERY DAY 06/14/19  Yes Conard NovakJackson,  D, MD   Allergies: No Known Allergies  Obstetric History: G0P0000  Social History   Socioeconomic History  . Marital status: Single    Spouse name: Not on file  . Number of children: Not on file  . Years of  education: Not on file  . Highest education level: Not on file  Occupational History  . Not on file  Social Needs  . Financial resource strain: Not on file  . Food insecurity    Worry: Not on file    Inability: Not on file  . Transportation needs    Medical: Not on file    Non-medical: Not on file  Tobacco Use  . Smoking status: Never Smoker  . Smokeless tobacco: Never Used  Substance and Sexual Activity  . Alcohol use: No  . Drug use: No  . Sexual activity: Yes    Birth control/protection: None, Pill  Lifestyle  . Physical activity    Days per week: Not on file    Minutes per session: Not on file  . Stress: Not on file  Relationships  . Social Musicianconnections    Talks on phone: Not on file    Gets together: Not on file    Attends religious service: Not on file    Active member of club or organization: Not on file    Attends meetings of clubs or organizations: Not on file    Relationship status: Not on file  . Intimate partner violence    Fear of current or ex partner: Not on file    Emotionally abused: Not on file    Physically abused: Not on file    Forced sexual activity:  Not on file  Other Topics Concern  . Not on file  Social History Narrative  . Not on file    Family History  Problem Relation Age of Onset  . Diabetes Mother   . Healthy Father   . Cancer Maternal Grandmother   . Cancer Maternal Grandfather     Review of Systems  Constitutional: Negative.   HENT: Negative.   Eyes: Negative.   Respiratory: Negative.   Cardiovascular: Negative.   Gastrointestinal: Negative.   Genitourinary: Negative.   Musculoskeletal: Negative.   Skin: Negative.   Neurological: Negative.   Psychiatric/Behavioral: Negative.      Physical Exam BP 102/70   Ht 5\' 2"  (1.575 m)   Wt 191 lb (86.6 kg)   LMP  (LMP Unknown)   BMI 34.93 kg/m    Physical Exam Constitutional:      General: She is not in acute distress.    Appearance: Normal appearance. She is  well-developed.  Genitourinary:     Pelvic exam was performed with patient supine.     Vulva, urethra, bladder and uterus normal.     No inguinal adenopathy present in the right or left side.    No signs of injury in the vagina.     No vaginal discharge, erythema, tenderness or bleeding.     No cervical motion tenderness, discharge, lesion or polyp.     Uterus is mobile.     Uterus is not enlarged or tender.     No uterine mass detected.    Uterus is anteverted.     No right or left adnexal mass present.     Right adnexa not tender or full.     Left adnexa not tender or full.  HENT:     Head: Normocephalic and atraumatic.  Eyes:     General: No scleral icterus.    Conjunctiva/sclera: Conjunctivae normal.  Neck:     Musculoskeletal: Normal range of motion and neck supple.     Thyroid: No thyromegaly.  Cardiovascular:     Rate and Rhythm: Normal rate and regular rhythm.     Heart sounds: No murmur. No friction rub. No gallop.   Pulmonary:     Effort: Pulmonary effort is normal. No respiratory distress.     Breath sounds: Normal breath sounds. No wheezing or rales.  Chest:     Breasts:        Right: No inverted nipple, mass, nipple discharge, skin change or tenderness.        Left: No inverted nipple, mass, nipple discharge, skin change or tenderness.  Abdominal:     General: Bowel sounds are normal. There is no distension.     Palpations: Abdomen is soft. There is no mass.     Tenderness: There is no abdominal tenderness. There is no guarding or rebound.  Musculoskeletal: Normal range of motion.        General: No swelling or tenderness.  Lymphadenopathy:     Cervical: No cervical adenopathy.     Lower Body: No right inguinal adenopathy. No left inguinal adenopathy.  Neurological:     General: No focal deficit present.     Mental Status: She is alert and oriented to person, place, and time.     Cranial Nerves: No cranial nerve deficit.  Skin:    General: Skin is warm and  dry.     Findings: No erythema or rash.  Psychiatric:        Mood and Affect: Mood normal.  Behavior: Behavior normal.        Judgment: Judgment normal.     Female chaperone present for pelvic and breast  portions of the physical exam  Results: AUDIT Questionnaire (screen for alcoholism): 2 PHQ-9: 0  Assessment: 23 y.o. G0P0000 female here for routine annual gynecologic examination  Plan: Problem List Items Addressed This Visit      Endocrine   Polycystic ovary syndrome   Relevant Orders   Prolactin (LC)   Lipid Panel With LDL/HDL Ratio (LC)   Hgb A1c (LC)    Other Visit Diagnoses    Women's annual routine gynecological examination    -  Primary   Relevant Orders   Cytology - PAP   Prolactin (LC)   Lipid Panel With LDL/HDL Ratio (LC)   Hgb A1c (LC)   Screening for depression       Screening for alcoholism       Screen for STD (sexually transmitted disease)       Relevant Orders   Cytology - PAP   Pap smear for cervical cancer screening       Relevant Orders   Cytology - PAP   Hyperprolactinemia (HCC)       Relevant Orders   Prolactin (LC)     Screening: -- Blood pressure screen normal -- Weight screening: obese: discussed management options, including lifestyle, dietary, and exercise. -- Depression screening negative (PHQ-9) -- Nutrition: normal -- cholesterol screening: will obtain -- osteoporosis screening: not due -- tobacco screening: not using -- alcohol screening: AUDIT questionnaire indicates low-risk usage. -- family history of breast cancer screening: done. not at high risk. -- no evidence of domestic violence or intimate partner violence. -- STD screening: gonorrhea/chlamydia NAAT collected -- pap smear collected per ASCCP guidelines -- flu vaccine: will get today -- HPV vaccination series: received;  PCOS: repeat labs that were elevated last year and repeat hemoglobin A1c.  Recommend she be on combined OCPs until she is ready to  conceive. She plans to get married sometime next year.   Thomasene Mohair, MD 10/05/2019 10:58 AM

## 2019-10-06 LAB — LIPID PANEL WITH LDL/HDL RATIO
Cholesterol, Total: 200 mg/dL — ABNORMAL HIGH (ref 100–199)
HDL: 52 mg/dL (ref >39)
LDL Chol Calc (NIH): 119 mg/dL — ABNORMAL HIGH (ref 0–99)
LDL/HDL Ratio: 2.3 ratio (ref 0.0–3.2)
Triglycerides: 165 mg/dL — ABNORMAL HIGH (ref 0–149)
VLDL Cholesterol Cal: 29 mg/dL (ref 5–40)

## 2019-10-06 LAB — PROLACTIN: Prolactin: 17.7 ng/mL (ref 4.8–23.3)

## 2019-10-06 LAB — HGB A1C W/O EAG: Hgb A1c MFr Bld: 5.1 % (ref 4.8–5.6)

## 2019-10-07 ENCOUNTER — Other Ambulatory Visit: Payer: Self-pay

## 2019-10-07 DIAGNOSIS — E669 Obesity, unspecified: Secondary | ICD-10-CM

## 2019-10-07 NOTE — Telephone Encounter (Signed)
Pt had an appointment with SDJ on 11/17. Please advise

## 2019-10-08 LAB — CYTOLOGY - PAP
Chlamydia: NEGATIVE
Comment: NEGATIVE
Comment: NORMAL
Neisseria Gonorrhea: NEGATIVE

## 2019-10-09 ENCOUNTER — Telehealth: Payer: Self-pay | Admitting: Obstetrics and Gynecology

## 2019-10-09 NOTE — Telephone Encounter (Signed)
Left generic VM 

## 2019-10-11 NOTE — Telephone Encounter (Signed)
Discussed findings of LGSIL with patient.  Discussed spread of HPV and usual, natural course.  Recommended follow up is repeat pap smear in one year, per ASCCP guidelines.  All questions answered.  Patient voiced understanding of the importance of this follow up.

## 2019-10-11 NOTE — Telephone Encounter (Signed)
Patient is returning missed call from SDJ . Please advise °

## 2019-11-03 ENCOUNTER — Ambulatory Visit: Payer: Federal, State, Local not specified - PPO

## 2019-11-03 ENCOUNTER — Encounter: Payer: Self-pay | Admitting: Obstetrics and Gynecology

## 2019-12-09 ENCOUNTER — Other Ambulatory Visit: Payer: Self-pay | Admitting: Obstetrics and Gynecology

## 2019-12-09 DIAGNOSIS — E669 Obesity, unspecified: Secondary | ICD-10-CM

## 2019-12-09 MED ORDER — PHENTERMINE HCL 37.5 MG PO TABS: ORAL_TABLET | ORAL | 0 refills | Status: DC

## 2019-12-09 NOTE — Progress Notes (Signed)
Initial height: 62" Initial Weight: 185 lb BMI: 33.83 kg/m^2  Starting phentermine. Patient counseled regarding risk of phentermine and pregnancy. Will follow up in one month to assess response.  She has taken the medication in the past.

## 2019-12-21 ENCOUNTER — Other Ambulatory Visit: Payer: Self-pay | Admitting: Obstetrics and Gynecology

## 2019-12-21 DIAGNOSIS — E282 Polycystic ovarian syndrome: Secondary | ICD-10-CM

## 2020-01-08 ENCOUNTER — Other Ambulatory Visit: Payer: Self-pay | Admitting: Obstetrics and Gynecology

## 2020-01-08 DIAGNOSIS — E669 Obesity, unspecified: Secondary | ICD-10-CM

## 2020-01-10 NOTE — Telephone Encounter (Signed)
Advise, last appointment was 09/2019

## 2020-04-10 ENCOUNTER — Telehealth: Payer: Federal, State, Local not specified - PPO | Admitting: Physician Assistant

## 2020-04-10 DIAGNOSIS — R102 Pelvic and perineal pain: Secondary | ICD-10-CM

## 2020-04-10 NOTE — Progress Notes (Signed)
Hi Bannie,   I am sorry you are not feeling well.  We are happy to help and treat conditions such as genital herpes as long as there has been a diagnosis of this condition in the past my a medial provider.  I would hate to incorrectly treat you.   I would feel more comfortable if you were seen face to face for a physical exam and possible lab work so that an accurate diagnosis can be made.     Please try to get in with your GYN or PCP as soon as possible. If there are no appointments available, see below for one of our Urgent Care locations.   Based on what you shared with me, I feel your condition warrants further evaluation and I recommend that you be seen for a face to face office visit.   NOTE: If you entered your credit card information for this eVisit, you will not be charged. You may see a "hold" on your card for the $35 but that hold will drop off and you will not have a charge processed.   If you are having a true medical emergency please call 911.      For an urgent face to face visit, [place] has five urgent care centers for your convenience:      NEW:  [facility] Urgent Care Center at [facility] Directions [phone] [address] [place], [place] [address] . 10 am - 6pm Monday - Friday    [facility] Health Urgent Care Center [facility]) Get Driving Directions [phone] [address] [place], [place] [address] . 10 am to 8 pm Monday-Friday . 12 pm to 8 pm [facility] Urgent Care at [facility] Get Driving Directions [phone] [address] [place] [address] [place], [place] [address] . 8 am to 8 pm Monday-Friday . 9 am to 6 pm Saturday . 11 am to 6 pm Sunday     [facility] Health Ur[facility] Care at [facility] Get Driving Directions  [phone] [address] [place], [place] [address] . 8 am to 8 pm Monday-Friday . 8 am to 4 pm [facility] Urgent Care at  [facility] Directions [phone] [address] [place], [place] [address] . 12 pm to 6 pm Monday-Friday      Your e-visit answers were reviewed by a board certified advanced clinical practitioner to complete your personal care plan.  Thank you for using e-Visits.

## 2020-08-23 ENCOUNTER — Telehealth: Payer: Federal, State, Local not specified - PPO | Admitting: Family

## 2020-08-23 DIAGNOSIS — R399 Unspecified symptoms and signs involving the genitourinary system: Secondary | ICD-10-CM | POA: Diagnosis not present

## 2020-08-23 MED ORDER — SULFAMETHOXAZOLE-TRIMETHOPRIM 800-160 MG PO TABS: 1.0000 | ORAL_TABLET | Freq: Two times a day (BID) | ORAL | 0 refills | Status: DC

## 2020-08-23 NOTE — Progress Notes (Signed)
We are sorry that you are not feeling well.  Here is how we plan to help!  Based on what you shared with me it looks like you most likely have a simple urinary tract infection.  A UTI (Urinary Tract Infection) is a bacterial infection of the bladder.  Most cases of urinary tract infections are simple to treat but a key part of your care is to encourage you to drink plenty of fluids and watch your symptoms carefully.  I have prescribed Bactrim DS One tablet twice a day for 5 days.  Your symptoms should gradually improve. Call us if the burning in your urine worsens, you develop worsening fever, back pain or pelvic pain or if your symptoms do not resolve after completing the antibiotic.  Urinary tract infections can be prevented by drinking plenty of water to keep your body hydrated.  Also be sure when you wipe, wipe from front to back and don't hold it in!  If possible, empty your bladder every 4 hours.  Your e-visit answers were reviewed by a board certified advanced clinical practitioner to complete your personal care plan.  Depending on the condition, your plan could have included both over the counter or prescription medications.  If there is a problem please reply  once you have received a response from your provider.  Your safety is important to us.  If you have drug allergies check your prescription carefully.    You can use MyChart to ask questions about today's visit, request a non-urgent call back, or ask for a work or school excuse for 24 hours related to this e-Visit. If it has been greater than 24 hours you will need to follow up with your provider, or enter a new e-Visit to address those concerns.   You will get an e-mail in the next two days asking about your experience.  I hope that your e-visit has been valuable and will speed your recovery. Thank you for using e-visits.   Approximately 5 minutes was spent documenting and reviewing patient's chart.   

## 2020-09-18 ENCOUNTER — Telehealth: Payer: Federal, State, Local not specified - PPO | Admitting: Family

## 2020-09-18 DIAGNOSIS — N390 Urinary tract infection, site not specified: Secondary | ICD-10-CM

## 2020-09-18 NOTE — Progress Notes (Signed)
Based on what you shared with me, I feel your condition warrants further evaluation and I recommend that you be seen for a face to face office visit.    Given you were just treated for a UTI less than a month, you need to be seen face to face for a urine culture.    NOTE: If you entered your credit card information for this eVisit, you will not be charged. You may see a "hold" on your card for the $35 but that hold will drop off and you will not have a charge processed.   If you are having a true medical emergency please call 911.      For an urgent face to face visit, [place] has five urgent care centers for your convenience:     [facility] Health Urgent Care Center at [facility] Directions [phone] [address] [place], [place] [address] . 10 am - 6pm Monday - Friday    [facility] Health Ur[facility] Care Center [facility]) Get Driving Directions [phone] [address] [place], [place] [address] . 10 am to 8 pm Monday-Friday . 12 pm to 8 pm [facility] Urgent Care at [facility] Get Driving Directions [phone] [address] [place] [address] [place], [place] [address] . 8 am to 8 pm Monday-Friday . 9 am to 6 pm Saturday . 11 am to 6 pm Sunday     [facility] Health Urgent Care at [facility] Get Driving Directions  [phone] [address] [place], [place] [address] . 8 am to 8 pm Monday-Friday . 8 am to 4 pm [facility] Urgent Care at [facility] Directions [phone] [address] [place], [place] [address] . 12 pm to 6 pm Monday-Friday      Your e-visit answers were reviewed by a board certified advanced clinical practitioner to complete your personal care plan.  Thank you for using e-Visits.

## 2020-10-15 ENCOUNTER — Telehealth: Payer: Federal, State, Local not specified - PPO | Admitting: Physician Assistant

## 2020-10-15 ENCOUNTER — Encounter: Payer: Self-pay | Admitting: Physician Assistant

## 2020-10-15 DIAGNOSIS — N390 Urinary tract infection, site not specified: Secondary | ICD-10-CM

## 2020-10-15 NOTE — Progress Notes (Signed)
Based on what you shared with me, I feel your condition warrants further evaluation and I recommend that you be seen for a face to face office visit.  Ms. Holly Wu,  Review of your records show that you have had multiple UTIs in the past 6 months. Recurrent UTIs warrant face to face evaluation to ensure proper diagnosis and treatment of your symptoms.     NOTE: If you entered your credit card information for this eVisit, you will not be charged. You may see a "hold" on your card for the $35 but that hold will drop off and you will not have a charge processed.   If you are having a true medical emergency please call 911.      For an urgent face to face visit, [name] has five urgent care centers for your convenience:     [facility] Health Urgent Care Center at [facility] Directions [phone] [address] [place], [place] [address] . 10 am - 6pm Monday - Friday    [facility] Health Urgent Care Center [facility]) Get Driving Directions [phone] [address] [place], [place] [address] . 10 am to 8 pm Monday-Friday . 12 pm to 8 pm [facility] Urgent Care at [facility] Get Driving Directions [phone] [address] [place] [address] [place], [place] [address] . 8 am to 8 pm Monday-Friday . 9 am to 6 pm Saturday . 11 am to 6 pm Sunday     [facility] Urgent Care at [facility] Get Driving Directions  [phone] [address] [place], [place] [address] . 8 am to 8 pm Monday-Friday . 8 am to 4 pm [facility] Urgent Care at [facility] Directions [phone] [address] [place], [place] [address] . 12 pm to 6 pm Monday-Friday      Your e-visit answers were reviewed by a board certified advanced clinical practitioner to complete your personal care plan.  Thank you for using e-Visits.    I spent 5-10 minutes on review and completion of this note- Illa Level  [facility]

## 2021-08-30 ENCOUNTER — Telehealth: Payer: Federal, State, Local not specified - PPO | Admitting: Physician Assistant

## 2021-08-30 DIAGNOSIS — Z3490 Encounter for supervision of normal pregnancy, unspecified, unspecified trimester: Secondary | ICD-10-CM

## 2021-08-30 NOTE — Progress Notes (Signed)
For the safety of you and your child, I recommend a face to face office visit with a health care provider.  Many mothers need to take medicines during their pregnancy and while nursing.  Almost all medicines pass into the breast milk in small quantities.  Most are generally considered safe for a mother to take but some medicines must be avoided.  After reviewing your E-Visit request, I recommend that you consult your OB/GYN or pediatrician for medical advice in relation to your condition and prescription medications while pregnant or breastfeeding.  NOTE:  There will be NO CHARGE for this eVisit  If you are having a true medical emergency please call 911.    For an urgent face to face visit, [place] has six urgent care centers for your convenience:     [facility] at [place] Get Driving Directions [phone] [address] [place], [place] [address]    [facility]) Get Driving Directions [phone] [address] [place], [place] [address]  [facility] [facility] [place]) Get Driving Directions [phone] [address] ,  [place]  [address]  [place] [facility] at [facility] Get Driving Directions [phone] [address] [place], [place] [address]   [place] [facility] at [facility] [place] Get Driving Directions  [phone] [address] [place], [place] [address]   [place] [facility] at [place] Get Driving Directions [phone] [address] [place], [place] [address]  Your MyChart E-visit questionnaire answers were reviewed by a board certified advanced clinical practitioner to complete your personal care plan based on your specific symptoms.  Thank you for using e-Visits. 

## 2021-10-02 ENCOUNTER — Encounter: Payer: Federal, State, Local not specified - PPO | Admitting: Obstetrics and Gynecology

## 2021-10-22 ENCOUNTER — Encounter: Payer: Federal, State, Local not specified - PPO | Admitting: Advanced Practice Midwife

## 2021-10-31 ENCOUNTER — Encounter: Payer: Self-pay | Admitting: Advanced Practice Midwife

## 2021-10-31 ENCOUNTER — Ambulatory Visit (INDEPENDENT_AMBULATORY_CARE_PROVIDER_SITE_OTHER): Payer: Federal, State, Local not specified - PPO | Admitting: Advanced Practice Midwife

## 2021-10-31 ENCOUNTER — Other Ambulatory Visit: Payer: Self-pay

## 2021-10-31 ENCOUNTER — Other Ambulatory Visit (HOSPITAL_COMMUNITY)
Admission: RE | Admit: 2021-10-31 | Discharge: 2021-10-31 | Disposition: A | Discharge status: Home / Self Care | Payer: Federal, State, Local not specified - PPO | Source: Ambulatory Visit | Attending: Obstetrics and Gynecology | Admitting: Obstetrics and Gynecology

## 2021-10-31 VITALS — BP 135/82 | Wt 161.0 lb

## 2021-10-31 DIAGNOSIS — Z3490 Encounter for supervision of normal pregnancy, unspecified, unspecified trimester: Secondary | ICD-10-CM | POA: Insufficient documentation

## 2021-10-31 DIAGNOSIS — Z113 Encounter for screening for infections with a predominantly sexual mode of transmission: Secondary | ICD-10-CM | POA: Diagnosis not present

## 2021-10-31 DIAGNOSIS — Z369 Encounter for antenatal screening, unspecified: Secondary | ICD-10-CM | POA: Insufficient documentation

## 2021-10-31 DIAGNOSIS — Z3A16 16 weeks gestation of pregnancy: Secondary | ICD-10-CM

## 2021-10-31 DIAGNOSIS — Z3402 Encounter for supervision of normal first pregnancy, second trimester: Secondary | ICD-10-CM

## 2021-10-31 DIAGNOSIS — N926 Irregular menstruation, unspecified: Secondary | ICD-10-CM | POA: Diagnosis not present

## 2021-10-31 DIAGNOSIS — Z1159 Encounter for screening for other viral diseases: Secondary | ICD-10-CM

## 2021-10-31 DIAGNOSIS — Z8679 Personal history of other diseases of the circulatory system: Secondary | ICD-10-CM

## 2021-10-31 DIAGNOSIS — Z124 Encounter for screening for malignant neoplasm of cervix: Secondary | ICD-10-CM

## 2021-10-31 LAB — POCT URINALYSIS DIPSTICK OB
Glucose, UA: NEGATIVE
POC,PROTEIN,UA: NEGATIVE

## 2021-10-31 LAB — POCT URINE PREGNANCY: Preg Test, Ur: POSITIVE — AB

## 2021-10-31 NOTE — Patient Instructions (Signed)

## 2021-11-01 NOTE — Progress Notes (Addendum)
New Obstetric Patient H&P  Date of Service: 10/31/2021  Chief Complaint: "Desires prenatal care"   History of Present Illness: Patient is a 25 y.o. G1P0000 Not Hispanic or Latino female, presents with amenorrhea and positive home pregnancy test. No LMP recorded (lmp unknown). Patient is pregnant. and based on timing of onset of symptoms, her EDD is Estimated Date of Delivery: 04/17/22 and her EGA is [redacted]w[redacted]d. Cycles are 4 days, irregular, and occur approximately every :  3-4 months . Her last pap smear was 2 years ago and was  LGSIL .    She had a urine pregnancy test which was positive 2 or 3 month(s)  ago. Her last menstrual period was approximately 1 year ago and lasted for  4 day(s). Since her LMP she claims she has experienced breast tenderness, fatigue, nausea, vomiting. She denies vaginal bleeding. Her past medical history is contributory for PCOS, Right Bundle Branch Block. This is her first pregnancy.  Since her LMP, she admits to the use of tobacco products  no She claims she has gained  10  pounds since the start of her pregnancy.  There are cats in the home in the home  no  She admits close contact with children on a regular basis  no  She has had chicken pox in the past no She has had Tuberculosis exposures, symptoms, or previously tested positive for TB   no Current or past history of domestic violence. no  Genetic Screening/Teratology Counseling: (Includes patient, baby's father, or anyone in either family with:)   1. Patient's age >/= 3 at [facility]  no 2. Thalassemia ([place], [place], Mediterranean, or Asian background): MCV<80  no 3. Neural tube defect (meningomyelocele, spina bifida, anencephaly)  no 4. Congenital heart defect  no  5. Down syndrome  no 6. Tay-Sachs (Jewish, [place]  no 7. Canavan's Disease  no 8. Sickle cell disease or trait (African)  no  9. Hemophilia or other blood disorders  no  10. Muscular dystrophy  no  11. Cystic fibrosis  no  12.  Huntington's Chorea  no  13. Mental retardation/autism  no 14. Other inherited genetic or chromosomal disorder  no 15. Maternal metabolic disorder (DM, PKU, etc)  no 16. Patient or FOB with a child with a birth defect not listed above no  16a. Patient or FOB with a birth defect themselves no 17. Recurrent pregnancy loss, or stillbirth  no  18. Any medications since LMP other than prenatal vitamins (include vitamins, supplements, OTC meds, drugs, alcohol)  no 19. Any other genetic/environmental exposure to discuss  no  Infection History:   1. Lives with someone with TB or TB exposed  no  2. Patient or partner has history of genital herpes  no 3. Rash or viral illness since LMP  no 4. History of STI (GC, CT, HPV, syphilis, HIV)  no 5. History of recent travel :  no  Other pertinent information:  no     Review of Systems:10 point review of systems negative unless otherwise noted in HPI  Past Medical History:  Patient Active Problem List   Diagnosis Date Noted   Supervision of normal pregnancy 10/31/2021     Nursing Staff Provider  Office Location  [facility] Dating    Language  English Anatomy US    Flu Vaccine   Genetic Screen  NIPS:   TDaP vaccine    Hgb A1C or  GTT Early : NA Third trimester :   Covid  LAB RESULTS   Rhogam   Blood Type     Feeding Plan  Antibody    Contraception  Rubella    Circumcision  RPR     Pediatrician   HBsAg     Support Person Josh HIV    Prenatal Classes  Varicella     GBS  (For PCN allergy, check sensitivities)   BTL Consent     VBAC Consent NA Pap  2020: LGSIL, 10/31/21:     Hgb Electro    Pelvis Tested NA CF      SMA            Obesity (BMI 30.0-34.9) 07/30/2018   Secondary oligomenorrhea 07/13/2018   Polycystic ovary syndrome 07/13/2018    Past Surgical History:  Past Surgical History:  Procedure Laterality Date   ESOPHAGUS SURGERY     foreign object removed    Gynecologic History: No LMP recorded (lmp unknown). Patient is  pregnant.  Obstetric History: G1P0000  Family History:  Family History  Problem Relation Age of Onset   Diabetes Mother    Healthy Father    Colon cancer Maternal Grandfather    Breast cancer Paternal Grandmother    Colon cancer Maternal Aunt    Breast cancer Other 39    Social History:  Social History   Socioeconomic History   Marital status: Single    Spouse name: Not on file   Number of children: Not on file   Years of education: Not on file   Highest education level: Not on file  Occupational History   Not on file  Tobacco Use   Smoking status: Never   Smokeless tobacco: Never  Vaping Use   Vaping Use: Some days  Substance and Sexual Activity   Alcohol use: No   Drug use: No   Sexual activity: Yes    Birth control/protection: None, Pill  Other Topics Concern   Not on file  Social History Narrative   Not on file   Social Determinants of Health   Financial Resource Strain: Not on file  Food Insecurity: Not on file  Transportation Needs: Not on file  Physical Activity: Not on file  Stress: Not on file  Social Connections: Not on file  Intimate Partner Violence: Not on file    Allergies:  No Known Allergies  Medications: Prior to Admission medications   Not on File    Physical Exam Vitals: Blood pressure 135/82, weight 161 lb (73 kg).  General: NAD HEENT: normocephalic, anicteric Thyroid: no enlargement, no palpable nodules Pulmonary: No increased work of breathing, CTAB Cardiovascular: RRR, distal pulses 2+ Abdomen: NABS, soft, non-tender, non-distended.  Umbilicus without lesions.  No hepatomegaly, splenomegaly or masses palpable. No evidence of hernia  Genitourinary:  External: Normal external female genitalia.  Normal urethral meatus, normal  Bartholin's and Skene's glands.    Vagina: Normal vaginal mucosa, no evidence of prolapse.    Cervix: Grossly normal in appearance, no bleeding, no CMT  Uterus: Enlarged, mobile, normal contour.     Rectal: deferred Extremities: no edema, erythema, or tenderness Neurologic: Grossly intact Psychiatric: mood appropriate, affect full   The following were addressed during this visit:  Breastfeeding Education - Early initiation of breastfeeding    Comments: Keeps milk supply adequate, helps contract uterus and slow bleeding, and early milk is the perfect first food and is easy to digest.   - The importance of exclusive breastfeeding    Comments: Provides antibodies, Lower risk of breast and ovarian cancers, and  type-2 diabetes,Helps your body recover, Reduced chance of SIDS.   - Risks of giving your baby anything other than breast milk if you are breastfeeding    Comments: Make the baby less content with breastfeeds, may make my baby more susceptible to illness, and may reduce my milk supply.   - The importance of early skin-to-skin contact    Comments:  Keeps baby warm and secure, helps keep babys blood sugar up and breathing steady, easier to bond and breastfeed, and helps calm baby.  - Rooming-in on a 24-hour basis    Comments: Easier to learn baby's feeding cues, easier to bond and get to know each other, and encourages milk production.   - Feeding on demand or baby-led feeding    Comments: Helps prevent breastfeeding complications, helps bring in good milk supply, prevents under or overfeeding, and helps baby feel content and satisfied   - Frequent feeding to help assure optimal milk production    Comments: Making a full supply of milk requires frequent removal of milk from breasts, infant will eat 8-12 times in 24 hours, if separated from infant use breast massage, hand expression and/ or pumping to remove milk from breasts.   - Effective positioning and attachment    Comments: Helps my baby to get enough breast milk, helps to produce an adequate milk supply, and helps prevent nipple pain and damage   - Exclusive breastfeeding for the first 6 months    Comments:  Builds a healthy milk supply and keeps it up, protects baby from sickness and disease, and breastmilk has everything your baby needs for the first 6 months.  - Individualized Education    Comments: Contraindications to breastfeeding and other special medical conditions She is uncertain if she wants to breastfeed and does accept BF education.    Assessment: 25 y.o. G1P0000 at [redacted]w[redacted]d by onset of symptoms presenting to initiate prenatal care  Plan: 1) Avoid alcoholic beverages. 2) Patient encouraged not to smoke.  3) Discontinue the use of all non-medicinal drugs and chemicals.  4) Take prenatal vitamins daily.  5) Nutrition, food safety (fish, cheese advisories, and high nitrite foods) and exercise discussed. 6) Hospital and practice style discussed with cross coverage system.  7) Genetic Screening, such as with 1st Trimester Screening, cell free fetal DNA, AFP testing, and Ultrasound, as well as with amniocentesis and CVS as appropriate, is discussed with patient. At the conclusion of today's visit patient requested genetic testing. She will check coverage with her insurance policy. 8) Patient is asked about travel to areas at risk for the Zika virus, and counseled to avoid travel and exposure to mosquitoes or sexual partners who may have themselves been exposed to the virus. Testing is discussed, and will be ordered as appropriate.  9) PAPtima, Urine Culture today 10) Return to clinic in 1 week for dating scan, NOB panel, ROB, MaterniT 21 if covered   Tresea Mall, CNM [facility] 11/01/2021, 10:05 AM

## 2021-11-04 LAB — URINE CULTURE

## 2021-11-05 LAB — CYTOLOGY - PAP
Chlamydia: NEGATIVE
Comment: NEGATIVE
Comment: NEGATIVE
Comment: NORMAL
Diagnosis: HIGH — AB
Neisseria Gonorrhea: NEGATIVE
Trichomonas: NEGATIVE

## 2021-11-06 ENCOUNTER — Other Ambulatory Visit: Payer: Self-pay

## 2021-11-06 ENCOUNTER — Ambulatory Visit
Admission: RE | Admit: 2021-11-06 | Discharge: 2021-11-06 | Disposition: A | Discharge status: Home / Self Care | Payer: Federal, State, Local not specified - PPO | Source: Ambulatory Visit | Attending: Advanced Practice Midwife | Admitting: Advanced Practice Midwife

## 2021-11-06 DIAGNOSIS — Z3A19 19 weeks gestation of pregnancy: Secondary | ICD-10-CM | POA: Diagnosis not present

## 2021-11-06 DIAGNOSIS — Z3402 Encounter for supervision of normal first pregnancy, second trimester: Secondary | ICD-10-CM | POA: Insufficient documentation

## 2021-11-06 DIAGNOSIS — Z369 Encounter for antenatal screening, unspecified: Secondary | ICD-10-CM | POA: Diagnosis not present

## 2021-11-06 DIAGNOSIS — Z3689 Encounter for other specified antenatal screening: Secondary | ICD-10-CM | POA: Diagnosis not present

## 2021-11-09 ENCOUNTER — Encounter: Payer: Self-pay | Admitting: Advanced Practice Midwife

## 2021-11-09 ENCOUNTER — Telehealth: Payer: Self-pay | Admitting: Advanced Practice Midwife

## 2021-11-09 NOTE — Addendum Note (Signed)
Addended by: Tresea Mall on: 11/09/2021 02:32 PM   Modules accepted: Orders

## 2021-11-09 NOTE — Telephone Encounter (Signed)
Called pt to schedule a colposcopy.  No answer and could not leave a message.

## 2021-11-13 NOTE — Telephone Encounter (Signed)
Patient is scheduled for 11/27/21 with RPH  

## 2021-11-18 HISTORY — PX: CHOLECYSTECTOMY: SHX55

## 2021-11-18 NOTE — L&D Delivery Note (Signed)
Delivery Summary for Janell Quiet Favero  Labor Events:   Preterm labor: No data found  Rupture date: 04/07/2022  Rupture time: 1:00 AM  Rupture type: Spontaneous Intact  Fluid Color: Clear  Induction: No data found  Augmentation: No data found  Complications: No data found  Cervical ripening: No data found No data found   No data found     Delivery:   Episiotomy: No data found  Lacerations: No data found  Repair suture: No data found  Repair # of packets: No data found  Blood loss (ml): No data found   Information for the patient's newborn:  Callyn, Severtson [[id number]]   Delivery 04/08/2022 5:08 AM by  C-Section, Low Transverse Sex:  female Gestational Age: [redacted]w[redacted]d Delivery Clinician:   Living?:         APGARS  One minute Five minutes Ten minutes  Skin color:        Heart rate:        Grimace:        Muscle tone:        Breathing:        Totals: 2  9      Presentation/position:      Resuscitation:   Cord information:    Disposition of cord blood:     Blood gases sent?  Complications:   Placenta: Delivered:       appearance Newborn Measurements: Weight: 9 lb 3.1 oz (4170 g)  Height: 22"  Head circumference:    Chest circumference:    Other providers:    Additional  information: Forceps:   Vacuum:   Breech:   Observed anomalies       See Dr. Oretha Milch operative note for details of C-section procedure.    Hildred Laser, MD [facility]

## 2021-11-27 ENCOUNTER — Ambulatory Visit (INDEPENDENT_AMBULATORY_CARE_PROVIDER_SITE_OTHER): Payer: Federal, State, Local not specified - PPO | Admitting: Obstetrics

## 2021-11-27 ENCOUNTER — Other Ambulatory Visit: Payer: Self-pay

## 2021-11-27 VITALS — BP 118/68 | Wt 170.0 lb

## 2021-11-27 DIAGNOSIS — Z3402 Encounter for supervision of normal first pregnancy, second trimester: Secondary | ICD-10-CM

## 2021-11-27 DIAGNOSIS — Z3A22 22 weeks gestation of pregnancy: Secondary | ICD-10-CM

## 2021-11-27 LAB — POCT URINALYSIS DIPSTICK OB
Glucose, UA: NEGATIVE
POC,PROTEIN,UA: NEGATIVE

## 2021-11-27 NOTE — Progress Notes (Signed)
Routine Prenatal Care Visit  Subjective  Holly Wu is a 26 y.o. G1P0000 at [redacted]w[redacted]d being seen today for ongoing prenatal care.  She is currently monitored for the following issues for this low-risk pregnancy and has Secondary oligomenorrhea; Polycystic ovary syndrome; Supervision of normal pregnancy; and History of right bundle branch block on their problem list.  ----------------------------------------------------------------------------------- Patient reports no complaints.  She is feeling fetal movement. Has decided to name her daughter Altamease Oiler. Contractions: Not present. Vag. Bleeding: None.  Movement: Present. Leaking Fluid denies.  ----------------------------------------------------------------------------------- The following portions of the patient's history were reviewed and updated as appropriate: allergies, current medications, past family history, past medical history, past social history, past surgical history and problem list. Problem list updated.  Objective  Blood pressure 118/68, weight 170 lb (77.1 kg). Pregravid weight 151 lb (68.5 kg) Total Weight Gain 19 lb (8.618 kg) Urinalysis: Urine Protein Negative  Urine Glucose Negative  Fetal Status:     Movement: Present     General:  Alert, oriented and cooperative. Patient is in no acute distress.  Skin: Skin is warm and dry. No rash noted.   Cardiovascular: Normal heart rate noted  Respiratory: Normal respiratory effort, no problems with respiration noted  Abdomen: Soft, gravid, appropriate for gestational age. Pain/Pressure: Absent     Pelvic:  Cervical exam deferred        Extremities: Normal range of motion.     Mental Status: Normal mood and affect. Normal behavior. Normal judgment and thought content.   Assessment   26 y.o. G1P0000 at [redacted]w[redacted]d by  04/01/2022, by Ultrasound presenting for routine prenatal visit  Plan   pregnancy 1 Problems (from 10/31/21 to present)    Problem Noted Resolved   Supervision  of normal pregnancy 10/31/2021 by Tresea Mall, CNM No   Overview Addendum 11/27/2021  2:47 PM by Mirna Mires, CNM     Nursing Staff Provider  Office Location  [facility] Dating  EDD by 19w u/s  Language  English Anatomy US  Complete/normal  Flu Vaccine   Genetic Screen  NIPS:   TDaP vaccine    Hgb A1C or  GTT Early : NA Third trimester :   Covid    LAB RESULTS   Rhogam   Blood Type     Feeding Plan  Antibody    Contraception  Rubella    Circumcision  RPR     Pediatrician   HBsAg     Support Person Josh HIV    Prenatal Classes  Varicella     GBS  (For PCN allergy, check sensitivities)   BTL Consent     VBAC Consent NA Pap  2020: LGSIL, 10/31/21:     Hgb Electro    Pelvis Tested NA CF      SMA               History of right bundle branch block 2017 by Tresea Mall, CNM No       Preterm labor symptoms and general obstetric precautions including but not limited to vaginal bleeding, contractions, leaking of fluid and fetal movement were reviewed in detail with the patient. Please refer to After Visit Summary for other counseling recommendations. Breastfeeding encouraged today. She has colpo appt next week. Discussed the 28 week labs.  Mirna Mires, CNM  11/27/2021 3:11 PM     Return in about 4 weeks (around 12/25/2021) for return OB.  Mirna Mires, CNM  11/27/2021 3:10 PM

## 2021-12-03 ENCOUNTER — Ambulatory Visit: Payer: Federal, State, Local not specified - PPO | Admitting: Obstetrics & Gynecology

## 2021-12-25 ENCOUNTER — Ambulatory Visit (INDEPENDENT_AMBULATORY_CARE_PROVIDER_SITE_OTHER): Payer: Federal, State, Local not specified - PPO | Admitting: Obstetrics

## 2021-12-25 ENCOUNTER — Other Ambulatory Visit: Payer: Self-pay

## 2021-12-25 VITALS — BP 122/70 | Wt 182.0 lb

## 2021-12-25 DIAGNOSIS — Z369 Encounter for antenatal screening, unspecified: Secondary | ICD-10-CM | POA: Diagnosis not present

## 2021-12-25 DIAGNOSIS — Z3402 Encounter for supervision of normal first pregnancy, second trimester: Secondary | ICD-10-CM

## 2021-12-25 DIAGNOSIS — Z3A26 26 weeks gestation of pregnancy: Secondary | ICD-10-CM

## 2021-12-25 DIAGNOSIS — Z1159 Encounter for screening for other viral diseases: Secondary | ICD-10-CM

## 2021-12-25 DIAGNOSIS — Z113 Encounter for screening for infections with a predominantly sexual mode of transmission: Secondary | ICD-10-CM | POA: Diagnosis not present

## 2021-12-25 LAB — POCT URINALYSIS DIPSTICK OB
Glucose, UA: NEGATIVE
POC,PROTEIN,UA: NEGATIVE

## 2021-12-25 NOTE — Progress Notes (Signed)
Routine Prenatal Care Visit  Subjective  Holly Wu is a 26 y.o. G1P0000 at [redacted]w[redacted]d being seen today for ongoing prenatal care.  She is currently monitored for the following issues for this high-risk pregnancy and has Secondary oligomenorrhea; Polycystic ovary syndrome; Supervision of normal pregnancy; and History of right bundle branch block on their problem list.  ----------------------------------------------------------------------------------- Patient reports no complaints. She was unaware that she missed her prenatal lab appt and has not had her blood work drawn. Feeling ample fetal movement. No concerns.  Contractions: Not present. Vag. Bleeding: None.  Movement: Present. Leaking Fluid denies.  ----------------------------------------------------------------------------------- The following portions of the patient's history were reviewed and updated as appropriate: allergies, current medications, past family history, past medical history, past social history, past surgical history and problem list. Problem list updated.  Objective  Blood pressure 122/70, weight 182 lb (82.6 kg). Pregravid weight 151 lb (68.5 kg) Total Weight Gain 31 lb (14.1 kg) Urinalysis: Urine Protein    Urine Glucose    Fetal Status:     Movement: Present     General:  Alert, oriented and cooperative. Patient is in no acute distress.  Skin: Skin is warm and dry. No rash noted.   Cardiovascular: Normal heart rate noted  Respiratory: Normal respiratory effort, no problems with respiration noted  Abdomen: Soft, gravid, appropriate for gestational age. Pain/Pressure: Absent     Pelvic:  Cervical exam deferred        Extremities: Normal range of motion.     Mental Status: Normal mood and affect. Normal behavior. Normal judgment and thought content.   Assessment   26 y.o. G1P0000 at [redacted]w[redacted]d by  04/01/2022, by Ultrasound presenting for routine prenatal visit  Plan   pregnancy 1 Problems (from 10/31/21 to  present)    Problem Noted Resolved   Supervision of normal pregnancy 10/31/2021 by Rod Can, CNM No   Overview Addendum 12/25/2021  2:50 PM by [name], CNM     Nursing Staff Provider  Office Location  [facility] Dating  EDD by 19w u/s  Language  English Anatomy US  Complete/normal  Flu Vaccine   Genetic Screen  NIPS:   TDaP vaccine    Hgb A1C or  GTT Early : NA Third trimester :   Covid    LAB RESULTS   Rhogam   Blood Type     Feeding Plan  Antibody    Contraception  Rubella    Circumcision  RPR     Pediatrician   HBsAg     Support Person Josh HIV    Prenatal Classes  Varicella     GBS  (For PCN allergy, check sensitivities)   BTL Consent     VBAC Consent NA Pap  2020: LGSIL, 10/31/21:     Hgb Electro    Pelvis Tested NA CF      SMA               History of right bundle branch block 2017 by Rod Can, CNM No       Preterm labor symptoms and general obstetric precautions including but not limited to vaginal bleeding, contractions, leaking of fluid and fetal movement were reviewed in detail with the patient. Please refer to After Visit Summary for other counseling recommendations.  Prenatal labs drawn.  Return for has her next appt set up . will have 28 week labs, GTT.  [name], CNM  12/25/2021 3:02 PM

## 2021-12-25 NOTE — Progress Notes (Signed)
No vb. No lof.  

## 2021-12-26 LAB — RPR+RH+ABO+RUB AB+AB SCR+CB...
Antibody Screen: NEGATIVE
HIV Screen 4th Generation wRfx: NONREACTIVE
Hematocrit: 35.5 % (ref 34.0–46.6)
Hemoglobin: 12.4 g/dL (ref 11.1–15.9)
Hepatitis B Surface Ag: NEGATIVE
MCH: 31.2 pg (ref 26.6–33.0)
MCHC: 34.9 g/dL (ref 31.5–35.7)
MCV: 89 fL (ref 79–97)
Platelets: 242 10*3/uL (ref 150–450)
RBC: 3.98 x10E6/uL (ref 3.77–5.28)
RDW: 12.1 % (ref 11.7–15.4)
RPR Ser Ql: NONREACTIVE
Rh Factor: POSITIVE
Rubella Antibodies, IGG: <0.9 index — ABNORMAL LOW (ref >0.99)
Varicella zoster IgG: <135 index — ABNORMAL LOW (ref >165)
WBC: 13.1 10*3/uL — ABNORMAL HIGH (ref 3.4–10.8)

## 2021-12-26 LAB — HEPATITIS C ANTIBODY: Hep C Virus Ab: <0.1 s/co ratio (ref 0.0–0.9)

## 2022-01-07 ENCOUNTER — Telehealth: Payer: Self-pay

## 2022-01-07 NOTE — Telephone Encounter (Signed)
I have attempt twice to reach patient about rescheduled appointment for Wednesday,01/09/22 with [facility] for Colpo. RPH is out of office patient has been rescheduled same time but with [facility] for ROB only. Patient's vociemail is not set up. Unable to leave message. Please let me know when patient calls back. Thank you

## 2022-01-09 ENCOUNTER — Other Ambulatory Visit: Payer: Federal, State, Local not specified - PPO

## 2022-01-09 ENCOUNTER — Ambulatory Visit: Payer: Federal, State, Local not specified - PPO | Admitting: Obstetrics & Gynecology

## 2022-01-09 ENCOUNTER — Ambulatory Visit (INDEPENDENT_AMBULATORY_CARE_PROVIDER_SITE_OTHER): Payer: Federal, State, Local not specified - PPO | Admitting: Obstetrics and Gynecology

## 2022-01-09 ENCOUNTER — Other Ambulatory Visit: Payer: Self-pay

## 2022-01-09 VITALS — BP 120/60 | Wt 191.0 lb

## 2022-01-09 DIAGNOSIS — Z8679 Personal history of other diseases of the circulatory system: Secondary | ICD-10-CM

## 2022-01-09 DIAGNOSIS — Z3402 Encounter for supervision of normal first pregnancy, second trimester: Secondary | ICD-10-CM | POA: Diagnosis not present

## 2022-01-09 DIAGNOSIS — Z3A28 28 weeks gestation of pregnancy: Secondary | ICD-10-CM

## 2022-01-09 DIAGNOSIS — Z3403 Encounter for supervision of normal first pregnancy, third trimester: Secondary | ICD-10-CM

## 2022-01-09 LAB — POCT URINALYSIS DIPSTICK OB
Glucose, UA: NEGATIVE
POC,PROTEIN,UA: NEGATIVE

## 2022-01-09 NOTE — Progress Notes (Signed)
°  Subjective  Fetal Movement? yes Contractions? no Leaking Fluid? no Vaginal Bleeding? no PNVs? yes  Objective  BP 120/60    Wt 191 lb (86.6 kg)    LMP  (LMP Unknown)    BMI 34.93 kg/m  General: NAD Pulmonary: no increased work of breathing Abdomen: gravid, non-tender Extremities: no edema Psychiatric: mood appropriate, affect full  Assessment  26 y.o. G1P0000 at [redacted]w[redacted]d by  04/01/2022, by Ultrasound presenting for routine prenatal visit  Plan   Problem List Items Addressed This Visit       Other   Supervision of normal pregnancy - Primary   Relevant Orders   POC Urinalysis Dipstick OB (Completed)   History of right bundle branch block   Other Visit Diagnoses     [redacted] weeks gestation of pregnancy       Relevant Orders   POC Urinalysis Dipstick OB (Completed)      Labs: 28 wk labs today. Pt to call ASAP to schedule cardio appt. Has colpo appt for HGSIL pap 01/11/22.  Pt had questions about doing paternity testing. Told her she could contact [facility] after baby delivered to do testing (per [facility] at [facility]).   Breast/POPs.   RTO 2 weeks for ROB  Holly Wu. Holly Wu, [employer],  01/09/2022  3:44 PM

## 2022-01-10 LAB — 28 WEEK RH+PANEL
Basophils Absolute: 0.1 10*3/uL (ref 0.0–0.2)
Basos: 1 %
EOS (ABSOLUTE): 0.1 10*3/uL (ref 0.0–0.4)
Eos: 1 %
Gestational Diabetes Screen: 71 mg/dL (ref 70–139)
HIV Screen 4th Generation wRfx: NONREACTIVE
Hematocrit: 32.4 % — ABNORMAL LOW (ref 34.0–46.6)
Hemoglobin: 10.8 g/dL — ABNORMAL LOW (ref 11.1–15.9)
Immature Grans (Abs): 0.9 10*3/uL — ABNORMAL HIGH (ref 0.0–0.1)
Immature Granulocytes: 5 %
Lymphocytes Absolute: 2.8 10*3/uL (ref 0.7–3.1)
Lymphs: 16 %
MCH: 29.8 pg (ref 26.6–33.0)
MCHC: 33.3 g/dL (ref 31.5–35.7)
MCV: 90 fL (ref 79–97)
Monocytes Absolute: 1.3 10*3/uL — ABNORMAL HIGH (ref 0.1–0.9)
Monocytes: 7 %
Neutrophils Absolute: 12.4 10*3/uL — ABNORMAL HIGH (ref 1.4–7.0)
Neutrophils: 70 %
Platelets: 235 10*3/uL (ref 150–450)
RBC: 3.62 x10E6/uL — ABNORMAL LOW (ref 3.77–5.28)
RDW: 12 % (ref 11.7–15.4)
RPR Ser Ql: NONREACTIVE
WBC: 17.6 10*3/uL — ABNORMAL HIGH (ref 3.4–10.8)

## 2022-01-11 ENCOUNTER — Ambulatory Visit (INDEPENDENT_AMBULATORY_CARE_PROVIDER_SITE_OTHER): Payer: Federal, State, Local not specified - PPO | Admitting: Obstetrics & Gynecology

## 2022-01-11 ENCOUNTER — Encounter: Payer: Self-pay | Admitting: Obstetrics & Gynecology

## 2022-01-11 ENCOUNTER — Other Ambulatory Visit: Payer: Self-pay

## 2022-01-11 ENCOUNTER — Other Ambulatory Visit (HOSPITAL_COMMUNITY)
Admission: RE | Admit: 2022-01-11 | Discharge: 2022-01-11 | Disposition: A | Discharge status: Home / Self Care | Payer: Federal, State, Local not specified - PPO | Source: Ambulatory Visit | Attending: Obstetrics & Gynecology | Admitting: Obstetrics & Gynecology

## 2022-01-11 VITALS — BP 120/80 | Ht 62.0 in | Wt 189.0 lb

## 2022-01-11 DIAGNOSIS — R87613 High grade squamous intraepithelial lesion on cytologic smear of cervix (HGSIL): Secondary | ICD-10-CM

## 2022-01-11 DIAGNOSIS — D069 Carcinoma in situ of cervix, unspecified: Secondary | ICD-10-CM | POA: Diagnosis not present

## 2022-01-11 NOTE — Progress Notes (Signed)
HPI:  Holly Wu is a 26 y.o.  G1P0000  who presents today for evaluation and management of abnormal cervical cytology.    Dysplasia History:  HGSIL by PAP in pregnancy Prior PAP 2020 LGSIL  ROS:  Pertinent items are noted in HPI.  OB History  Gravida Para Term Preterm AB Living  1 0 0 0 0 0  SAB IAB Ectopic Multiple Live Births  0 0 0 0 0    # Outcome Date GA Lbr Len/2nd Weight Sex Delivery Anes PTL Lv  1 Current             Past Medical History:  Diagnosis Date   EKG abnormalities    PCOS (polycystic ovarian syndrome)    Right bundle branch block 2017   Scoliosis     Past Surgical History:  Procedure Laterality Date   ESOPHAGUS SURGERY     foreign object removed    SOCIAL HISTORY:  Social History   Substance and Sexual Activity  Alcohol Use No    Social History   Substance and Sexual Activity  Drug Use No     Family History  Problem Relation Age of Onset   Diabetes Mother    Healthy Father    Colon cancer Maternal Grandfather    Breast cancer Paternal Grandmother    Colon cancer Maternal Aunt    Breast cancer Other 10    ALLERGIES:  Patient has no known allergies.  Current Outpatient Medications on File Prior to Visit  Medication Sig Dispense Refill   Prenatal Vit-Fe Fumarate-FA (MULTIVITAMIN-PRENATAL) 27-0.8 MG TABS tablet Take 1 tablet by mouth daily at 12 noon.     No current facility-administered medications on file prior to visit.    Physical Exam: -Vitals:  BP 120/80    Ht '5\' 2"'  (1.575 m)    Wt 189 lb (85.7 kg)    LMP  (LMP Unknown)    BMI 34.57 kg/m  GEN: WD, WN, NAD.  A+ O x 3, good mood and affect. ABD:  NT, ND.  Soft, no masses.  No hernias noted.   Pelvic:   Vulva: Normal appearance.  No lesions.  Vagina: No lesions or abnormalities noted.  Support: Normal pelvic support.  Urethra No masses tenderness or scarring.  Meatus Normal size without lesions or prolapse.  Cervix: See below.  Anus: Normal exam.  No  lesions.  Perineum: Normal exam.  No lesions.        Bimanual   Uterus: Normal size.  Non-tender.  Mobile.  AV.  Adnexae: No masses.  Non-tender to palpation.  Cul-de-sac: Negative for abnormality.   PROCEDURE: 1.  Urine Pregnancy Test:  positive 2.  Colposcopy performed with 4% acetic acid after verbal consent obtained                                         -Aceto-white Lesions Location(s): none.              -Biopsy performed at 6, 12 o'clock               -ECC indicated and performed: No.     -Biopsy sites made hemostatic with pressure, AgNO3, and/or Monsel's solution   -Satisfactory colposcopy: Yes.      -Evidence of Invasive cervical CA :  NO  ASSESSMENT:  Holly Wu is a 26 y.o. G1P0000 here for  1.  HGSIL (high grade squamous intraepithelial lesion) on Pap smear of cervix   .  PLAN: 1.  I discussed the grading system of pap smears and HPV high risk viral types.  We will discuss and base management after colpo results return. 2. Follow up PAP 6 months, vs intervention if high grade dysplasia identified 3. Pregnancy adjustments to surveillance and treatment discussed  4. Pt reports sister and mother have been found to have BRCA gene.  Counseled on risks, age to be tested usually, etc.  Will plan future (after pregnancy) testing.     Barnett Applebaum, MD, [facility], [facility] 01/11/2022  1:56 PM

## 2022-01-11 NOTE — Patient Instructions (Signed)
Colposcopy, Care After ?The following information offers guidance on how to care for yourself after your procedure. Your health care provider may also give you more specific instructions. If you have problems or questions, contact your health care provider. ?What can I expect after the procedure? ?If you had a colposcopy without a biopsy, you can expect to feel fine right away after your procedure. However, you may have some spotting of blood for a few days. You can return to your normal activities. ?If you had a colposcopy with a biopsy, it is common after the procedure to have: ?Soreness and mild pain. These may last for a few days. ?Mild vaginal bleeding or discharge that is dark-colored and grainy. This may last for a few days. The discharge may be caused by a liquid (solution) that was used during the procedure. You may need to wear a sanitary pad during this time. ?Spotting of blood for at least 48 hours after the procedure. ?Follow these instructions at home: ?Medicines ?Take over-the-counter and prescription medicines only as told by your health care provider. ?Talk with your health care provider about what type of over-the-counter pain medicines and prescription medicines you can start to take again. It is especially important to talk with your health care provider if you take blood thinners. ?Activity ?Avoid using douche products, using tampons, and having sex for at least 3 days after the procedure or for as long as told by your health care provider. ?Return to your normal activities as told by your health care provider. Ask your health care provider what activities are safe for you. ?General instructions ?Ask your health care provider if you may take baths, swim, or use a hot tub. You may take showers. ?If you use birth control (contraception), continue to use it. ?Keep all follow-up visits. This is important. ?Contact a health care provider if: ?You have a fever or chills. ?You faint or feel  light-headed. ?Get help right away if: ?You have heavy bleeding from your vagina or pass blood clots. Heavy bleeding is bleeding that soaks through a sanitary pad in less than 1 hour. ?You have vaginal discharge that is abnormal, is yellow in color, or smells bad. This could be a sign of infection. ?You have severe pain or cramps in your lower abdomen that do not go away with medicine. ?Summary ?If you had a colposcopy without a biopsy, you can expect to feel fine right away, but you may have some spotting of blood for a few days. You can return to your normal activities. ?If you had a colposcopy with a biopsy, it is common to have mild pain for a few days and spotting for 48 hours after the procedure. ?Avoid using douche products, using tampons, and having sex for at least 3 days after the procedure or for as long as told by your health care provider. ?Get help right away if you have heavy bleeding, severe pain, or signs of infection. ?This information is not intended to replace advice given to you by your health care provider. Make sure you discuss any questions you have with your health care provider. ?Document Revised: 04/01/2021 Document Reviewed: 04/01/2021 ?Elsevier Patient Education ? 2022 Elsevier Inc. ? ?

## 2022-01-16 ENCOUNTER — Other Ambulatory Visit: Payer: Self-pay | Admitting: Obstetrics & Gynecology

## 2022-01-16 LAB — SURGICAL PATHOLOGY

## 2022-01-16 NOTE — Progress Notes (Signed)
DIscussed cervical biopsy results ?PAP HGSIL ?BX- CIN 2 and CIN 3 ?  No invasive dz ?Pt is 29 weeks ? ?Below are recommendations per UTD: ?CIN 2,3 - Pregnant patients with CIN 2 or 3 in whom invasive disease is not suspected are managed accordingly [15]: ?Observation with colposcopy and cytology (with human papillomavirus [HPV] if age appropriate) every 12 to 24 weeks during the pregnancy is preferred. A biopsy may be repeated only if the appearance of the lesion worsens or if cytology suggests invasive disease. Endocervical sampling with a curette and endometrial sampling should not be performed as there is a risk of disturbing the pregnancy. ?Deferring colposcopy until after four weeks postpartum is an acceptable alternative. ?Treatment of CIN 2 or 3 is not recommended. ? ?So, plan colpo vs LEEP at 4 weeks post partum timeframe ? ?Barnett Applebaum, MD, FACOG ?[facility], [place] ?01/16/2022  8:02 AM ? ?

## 2022-01-23 ENCOUNTER — Encounter: Payer: Federal, State, Local not specified - PPO | Admitting: Obstetrics

## 2022-02-06 ENCOUNTER — Encounter: Payer: Federal, State, Local not specified - PPO | Admitting: Obstetrics

## 2022-02-06 ENCOUNTER — Other Ambulatory Visit: Payer: Self-pay

## 2022-02-06 ENCOUNTER — Ambulatory Visit (INDEPENDENT_AMBULATORY_CARE_PROVIDER_SITE_OTHER): Payer: Federal, State, Local not specified - PPO | Admitting: Obstetrics

## 2022-02-06 VITALS — BP 122/70 | Wt 200.0 lb

## 2022-02-06 DIAGNOSIS — Z3403 Encounter for supervision of normal first pregnancy, third trimester: Secondary | ICD-10-CM

## 2022-02-06 DIAGNOSIS — M4120 Other idiopathic scoliosis, site unspecified: Secondary | ICD-10-CM

## 2022-02-06 DIAGNOSIS — Z3A32 32 weeks gestation of pregnancy: Secondary | ICD-10-CM

## 2022-02-06 NOTE — Progress Notes (Signed)
No vb. No lof.  

## 2022-02-06 NOTE — Progress Notes (Signed)
Routine Prenatal Care Visit ? ?Subjective  ?Holly Wu is a 26 y.o. G1P0000 at [redacted]w[redacted]d being seen today for ongoing prenatal care.  She is currently monitored for the following issues for this low-risk pregnancy and has Secondary oligomenorrhea; Polycystic ovary syndrome; Supervision of normal pregnancy; and History of right bundle branch block on their problem list. She also relates a hx of scoliosis, and needs anesthesia to evaluate her for a possible epidural. ?----------------------------------------------------------------------------------- ?Patient reports no complaints.   ?Contractions: Not present. Vag. Bleeding: None.  Movement: Present. Leaking Fluid denies.  ?----------------------------------------------------------------------------------- ?The following portions of the patient's history were reviewed and updated as appropriate: allergies, current medications, past family history, past medical history, past social history, past surgical history and problem list. Problem list updated. ? ?Objective  ?Blood pressure 122/70, weight 200 lb (90.7 kg). ?Pregravid weight 151 lb (68.5 kg) Total Weight Gain 49 lb (22.2 kg) ?Urinalysis: Urine Protein    Urine Glucose   ? ?Fetal Status:     Movement: Present    ? ?General:  Alert, oriented and cooperative. Patient is in no acute distress.  ?Skin: Skin is warm and dry. No rash noted.   ?Cardiovascular: Normal heart rate noted  ?Respiratory: Normal respiratory effort, no problems with respiration noted  ?Abdomen: Soft, gravid, appropriate for gestational age. Pain/Pressure: Absent     ?Pelvic:  Cervical exam deferred        ?Extremities: Normal range of motion.     ?Mental Status: Normal mood and affect. Normal behavior. Normal judgment and thought content.  ? ?Assessment  ? ?26 y.o. G1P0000 at [redacted]w[redacted]d by  04/01/2022, by Ultrasound presenting for routine prenatal visit ? ?Plan  ? ?pregnancy 1 Problems (from 10/31/21 to present)   ? Problem Noted Resolved  ?  Supervision of normal pregnancy 10/31/2021 by Rod Can, CNM No  ? Overview Addendum 01/23/2022  1:52 PM by [name], CNM  ?   ?Nursing Staff Provider  ?Office Location  [facility] Dating  EDD by 19w u/s  ?Language  English Anatomy US  Complete/normal  ?Flu Vaccine   Genetic Screen  NIPS:   ?TDaP vaccine    Hgb A1C or  ?GTT Early : NA ?Third trimester : 72  ?Covid    LAB RESULTS   ?Rhogam   Blood Type A/Positive/-- (02/07 1502)   ?Feeding Plan  Antibody Negative (02/07 1502)  ?Contraception  Rubella <0.90 (02/07 1502)  ?Circumcision  RPR Non Reactive (02/22 1546)   ?Pediatrician   HBsAg Negative (02/07 1502)   ?Support Person Josh HIV Non Reactive (02/22 1546)  ?Prenatal Classes  Varicella   ?  GBS  (For PCN allergy, check sensitivities)   ?BTL Consent     ?VBAC Consent NA Pap  2020: LGSIL, 10/31/21:   ?  Hgb Electro    ?Pelvis Tested NA CF   ?   SMA   ?     ? ? ?  ?  ? History of right bundle branch block 2017 by Rod Can, CNM No  ?  ?  ? ?Preterm labor symptoms and general obstetric precautions including but not limited to vaginal bleeding, contractions, leaking of fluid and fetal movement were reviewed in detail with the patient. ?Please refer to After Visit Summary for other counseling recommendations.  ?Reviewed her 28 wk labs- encouraged in to increase her intake of iron rich foods. ?Referral made to naesthesia to evaluate her back for epidural ? ?Return in about 2 weeks (around 02/20/2022) for return OB. ? ?  [name], CNM  ?02/06/2022 3:08 PM   ? ?

## 2022-02-14 ENCOUNTER — Other Ambulatory Visit: Payer: Federal, State, Local not specified - PPO | Attending: Obstetrics

## 2022-02-21 ENCOUNTER — Encounter: Payer: Federal, State, Local not specified - PPO | Admitting: Advanced Practice Midwife

## 2022-03-11 ENCOUNTER — Ambulatory Visit (INDEPENDENT_AMBULATORY_CARE_PROVIDER_SITE_OTHER): Payer: Federal, State, Local not specified - PPO | Admitting: Obstetrics

## 2022-03-11 ENCOUNTER — Encounter: Payer: Self-pay | Admitting: Anesthesiology

## 2022-03-11 ENCOUNTER — Encounter
Admission: RE | Admit: 2022-03-11 | Discharge: 2022-03-11 | Disposition: A | Discharge status: Home / Self Care | Payer: Federal, State, Local not specified - PPO | Source: Ambulatory Visit | Attending: Anesthesiology | Admitting: Anesthesiology

## 2022-03-11 VITALS — BP 90/64 | Wt 207.0 lb

## 2022-03-11 DIAGNOSIS — Z113 Encounter for screening for infections with a predominantly sexual mode of transmission: Secondary | ICD-10-CM | POA: Diagnosis not present

## 2022-03-11 DIAGNOSIS — Z3403 Encounter for supervision of normal first pregnancy, third trimester: Secondary | ICD-10-CM | POA: Diagnosis not present

## 2022-03-11 DIAGNOSIS — Z3A37 37 weeks gestation of pregnancy: Secondary | ICD-10-CM

## 2022-03-11 NOTE — Consult Note (Signed)
[facility] ?Anesthesia Consultation ? Holly Wu FGH:[id number] DOB: 02/14/1996 DOA: 03/11/2022 ?PCP: Erick Colace, MD  ? ?Date of consultation: 03/11/2022 ?Reason for consultation: Scoliosis in setting of pregnancy and future desires for neuraxial anesthesia ? ?CHIEF COMPLAINT:  Scoliosis ? ?HISTORY OF PRESENT ILLNESS: Holly Wu  is a 26 y.o. female G1PO with a known history of RBBB, PCOS, and mild-mod scoliosis. Her scoliosis causes mild low back pain but pt denies cardiorespiratory effects or neurological effects. She had brace placed years ago but has no surgical correction. Imaging as below. ? ?PAST MEDICAL HISTORY:   ?Past Medical History:  ?Diagnosis Date  ? EKG abnormalities   ? PCOS (polycystic ovarian syndrome)   ? Right bundle branch block 2017  ? Scoliosis   ? ? ?PAST SURGICAL HISTORY:  ?Past Surgical History:  ?Procedure Laterality Date  ? ESOPHAGUS SURGERY    ? foreign object removed  ? ? ?SOCIAL HISTORY:  ?Social History  ? ?Tobacco Use  ? Smoking status: Never  ? Smokeless tobacco: Never  ?Substance Use Topics  ? Alcohol use: No  ? ? ?FAMILY HISTORY:  ?Family History  ?Problem Relation Age of Onset  ? Diabetes Mother   ? Healthy Father   ? Colon cancer Maternal Grandfather   ? Breast cancer Paternal Grandmother   ? Colon cancer Maternal Aunt   ? Breast cancer Other 60  ? ? ?DRUG ALLERGIES: No Known Allergies ? ?REVIEW OF SYSTEMS:  ? ?RESPIRATORY: No cough, shortness of breath, wheezing.  ?CARDIOVASCULAR: No chest pain, orthopnea, edema.  ?HEMATOLOGY: No anemia, easy bruising or bleeding ?SKIN: No rash or lesion. ?NEUROLOGIC: No tingling, numbness, weakness.  ?PSYCHIATRY: No anxiety or depression.  ? ?MEDICATIONS AT HOME:  ?Prior to Admission medications   ?Medication Sig Start Date End Date Taking? Authorizing Provider  ?Prenatal Vit-Fe Fumarate-FA (MULTIVITAMIN-PRENATAL) 27-0.8 MG TABS tablet Take 1 tablet by mouth daily at 12 noon.    [provider]  ? ?IMAGING: ? ?PROCEDURE: MDR - MDR SCOLIOSIS STUDY ENTIRE SPIN - Nov 01 2009  ?4:28PM  ?RESULT: There is a 16 degree cervicothoracic scoliosis with the  ?convexity to the left and centered at approximately the T3-T4 level. A  ?compensatory 16 degree thoracic scoliosis centered at T7 is noted with the  ?convexity to the right. There is a 6 degree lumbar scoliosis with the  ?convexity to the left and centered at approximately L2.  ?IMPRESSION:  ?1. There are multiple scoliotic curvatures as noted above. ? ?02/2012 Spine Film: ?FINDINGS: S-shaped scoliosis is again noted.  Thoracic dextroscoliosis  ?measures (upright) approximately 31 degrees extending from T5 to T10,  ?previously 32 degrees.  Thoracolumbar levoscoliosis measures approximately 24  ?degrees extending from T10 to L4, , previously 26 degrees.  ? ?The visualized lungs are clear.  The cardiomediastinal contour is unchanged.    ?No abnormal calcifications.  ? ?INTERPRETATION LOCATION:  [facility]  ? ?IMPRESSION:  ?Thoracolumbar scoliosis as above ? ?PHYSICAL EXAMINATION:  ? ?VITAL SIGNS: There were no vitals taken for this visit. ? ?GENERAL:  26 y.o.-year-old patient no acute distress.  ?HEENT: Head atraumatic, normocephalic. Oropharynx and nasopharynx clear. MP 2, TM distance >3 cm, normal mouth opening. ?LUNGS: No use of accessory muscles of respiration.   ?EXTREMITIES: No pedal edema, cyanosis, or clubbing.  ?NEUROLOGIC: normal gait ?PSYCHIATRIC: The patient is alert and oriented x 3.  ?SKIN: No obvious rash, lesion, or ulcer.  ?Back: no obvious scoliosis noted due to obesity ? ? ?IMPRESSION AND  PLAN: ? ? ?Holly Wu  is a 26 y.o. female presenting with scoliosis in preparation for neuraxial anesthesia. Imaging reviewed ? ?We discussed analgesic options during labor including epidural analgesia. Discussed that in scoliosis there can be increased difficulty with epidural placement and can require multiple attempts. We discussed the rate  of accidental dural puncture and post dural puncture headache may be elevated. Discussed use of epidural vs spinal vs GA if cesarean delivery is required. ? ? ?Nelta Numbers Md ?

## 2022-03-11 NOTE — Progress Notes (Signed)
ROB- GBS, cervix check, no concerns 

## 2022-03-11 NOTE — Progress Notes (Signed)
Routine Prenatal Care Visit ? ?Subjective  ?Holly Wu is a 26 y.o. G1P0000 at [redacted]w[redacted]d being seen today for ongoing prenatal care.  She is currently monitored for the following issues for this low-risk pregnancy and has Secondary oligomenorrhea; Polycystic ovary syndrome; Supervision of normal pregnancy; and History of right bundle branch block on their problem list.  ?----------------------------------------------------------------------------------- ?Patient reports no complaints.   ? .  .   . Leaking Fluid denies.  ?----------------------------------------------------------------------------------- ?The following portions of the patient's history were reviewed and updated as appropriate: allergies, current medications, past family history, past medical history, past social history, past surgical history and problem list. Problem list updated. ? ?Objective  ?Blood pressure 90/64, weight 207 lb (93.9 kg). ?Pregravid weight 151 lb (68.5 kg) Total Weight Gain 56 lb (25.4 kg) ?Urinalysis: Urine Protein    Urine Glucose   ? ?Fetal Status:          ? ?General:  Alert, oriented and cooperative. Patient is in no acute distress.  ?Skin: Skin is warm and dry. No rash noted.   ?Cardiovascular: Normal heart rate noted  ?Respiratory: Normal respiratory effort, no problems with respiration noted  ?Abdomen: Soft, gravid, appropriate for gestational age.       ?Pelvic:  closed/40%/-3. cervix has a "moat like feel", very anterior        ?Extremities: Normal range of motion.     ?Mental Status: Normal mood and affect. Normal behavior. Normal judgment and thought content.  ? ?Assessment  ? ?26 y.o. G1P0000 at [redacted]w[redacted]d by  04/01/2022, by Ultrasound presenting for routine prenatal visit ? ?Plan  ? ?pregnancy 1 Problems (from 10/31/21 to present)   ? Problem Noted Resolved  ? Supervision of normal pregnancy 10/31/2021 by Rod Can, CNM No  ? Overview Addendum 03/11/2022  3:24 PM by [name], CNM  ?   ?Nursing Staff  Provider  ?Office Location  [facility] Dating  EDD by 19w u/s  ?Language  English Anatomy US  Complete/normal  ?Flu Vaccine   Genetic Screen  NIPS:   ?TDaP vaccine    Hgb A1C or  ?GTT Early : NA ?Third trimester : 59  ?Covid    LAB RESULTS   ?Rhogam   Blood Type A/Positive/-- (02/07 1502)   ?Feeding Plan  Antibody Negative (02/07 1502)  ?Contraception  Rubella <0.90 (02/07 1502)  ?Circumcision  RPR Non Reactive (02/22 1546)   ?Pediatrician   HBsAg Negative (02/07 1502)   ?Support Person Josh HIV Non Reactive (02/22 1546)  ?Prenatal Classes  Varicella   ?  GBS  (For PCN allergy, check sensitivities) 4/24  ?BTL Consent     ?VBAC Consent NA Pap  2020: LGSIL, 10/31/21:   ?  Hgb Electro    ?Pelvis Tested NA CF   ?   SMA   ?     ? ? ?  ?  ? History of right bundle branch block 2017 by Rod Can, CNM No  ?  ?  ? ?Term labor symptoms and general obstetric precautions including but not limited to vaginal bleeding, contractions, leaking of fluid and fetal movement were reviewed in detail with the patient. ?Please refer to After Visit Summary for other counseling recommendations. ?GBS and cultures today. ?She measures slightly larger for dates.  ? ?No follow-ups on file. ? ?[name], CNM  ?03/11/2022 3:38 PM   ? ?

## 2022-03-14 LAB — GC/CHLAMYDIA PROBE AMP
Chlamydia trachomatis, NAA: NEGATIVE
Neisseria Gonorrhoeae by PCR: NEGATIVE

## 2022-03-15 LAB — CULTURE, BETA STREP (GROUP B ONLY): Strep Gp B Culture: NEGATIVE

## 2022-03-18 ENCOUNTER — Encounter: Payer: Self-pay | Admitting: Advanced Practice Midwife

## 2022-03-18 ENCOUNTER — Ambulatory Visit (INDEPENDENT_AMBULATORY_CARE_PROVIDER_SITE_OTHER): Payer: Federal, State, Local not specified - PPO | Admitting: Advanced Practice Midwife

## 2022-03-18 ENCOUNTER — Encounter: Payer: Federal, State, Local not specified - PPO | Admitting: Licensed Practical Nurse

## 2022-03-18 VITALS — BP 120/70 | Wt 213.0 lb

## 2022-03-18 DIAGNOSIS — Z3A38 38 weeks gestation of pregnancy: Secondary | ICD-10-CM

## 2022-03-18 DIAGNOSIS — Z3403 Encounter for supervision of normal first pregnancy, third trimester: Secondary | ICD-10-CM

## 2022-03-18 LAB — POCT URINALYSIS DIPSTICK OB
Glucose, UA: NEGATIVE
POC,PROTEIN,UA: NEGATIVE

## 2022-03-18 NOTE — Progress Notes (Signed)
Routine Prenatal Care Visit ? ?Subjective  ?Holly Wu is a 26 y.o. G1P0000 at [redacted]w[redacted]d being seen today for ongoing prenatal care.  She is currently monitored for the following issues for this low-risk pregnancy and has Secondary oligomenorrhea; Polycystic ovary syndrome; Supervision of normal pregnancy; and History of right bundle branch block on their problem list.  ?----------------------------------------------------------------------------------- ?Patient reports pelvic pressure and "ready to have baby".   ?Contractions: Not present. Vag. Bleeding: None.  Movement: Present. Leaking Fluid denies.  ?----------------------------------------------------------------------------------- ?The following portions of the patient's history were reviewed and updated as appropriate: allergies, current medications, past family history, past medical history, past social history, past surgical history and problem list. Problem list updated. ? ?Objective  ?Blood pressure 120/70, weight 213 lb (96.6 kg). ?Pregravid weight 151 lb (68.5 kg) Total Weight Gain 62 lb (28.1 kg) ?Urinalysis: Urine Protein    Urine Glucose   ? ?Fetal Status: Fetal Heart Rate (bpm): 129 Fundal Height: 41 cm Movement: Present    ? ?General:  Alert, oriented and cooperative. Patient is in no acute distress.  ?Skin: Skin is warm and dry. No rash noted.   ?Cardiovascular: Normal heart rate noted  ?Respiratory: Normal respiratory effort, no problems with respiration noted  ?Abdomen: Soft, gravid, appropriate for gestational age. Pain/Pressure: Present     ?Pelvic:  Cervical exam performed Dilation: 1 Effacement (%): 50, 60 Station: -2  ?Extremities: Normal range of motion.  Edema: None  ?Mental Status: Normal mood and affect. Normal behavior. Normal judgment and thought content.  ? ?Assessment  ? ?26 y.o. G1P0000 at [redacted]w[redacted]d by  04/01/2022, by Ultrasound presenting for routine prenatal visit ? ?Plan  ? ?pregnancy 1 Problems (from 10/31/21 to present)   ?  Problem Noted Resolved  ? Supervision of normal pregnancy 10/31/2021 by Tresea Mall, CNM No  ? Overview Addendum 03/11/2022  3:24 PM by Mirna Mires, CNM  ?   ?Nursing Staff Provider  ?Office Location  [facility] Dating  EDD by 19w u/s  ?Language  English Anatomy US  Complete/normal  ?Flu Vaccine   Genetic Screen  NIPS:   ?TDaP vaccine    Hgb A1C or  ?GTT Early : NA ?Third trimester : 25  ?Covid    LAB RESULTS   ?Rhogam   Blood Type A/Positive/-- (02/07 1502)   ?Feeding Plan  Antibody Negative (02/07 1502)  ?Contraception  Rubella <0.90 (02/07 1502)  ?Circumcision  RPR Non Reactive (02/22 1546)   ?Pediatrician   HBsAg Negative (02/07 1502)   ?Support Person Josh HIV Non Reactive (02/22 1546)  ?Prenatal Classes  Varicella   ?  GBS  (For PCN allergy, check sensitivities) 4/24  ?BTL Consent     ?VBAC Consent NA Pap  2020: LGSIL, 10/31/21:   ?  Hgb Electro    ?Pelvis Tested NA CF   ?   SMA   ?     ? ? ?  ?  ? History of right bundle branch block 2017 by Tresea Mall, CNM No  ?  ?  ? ?Term labor symptoms and general obstetric precautions including but not limited to vaginal bleeding, contractions, leaking of fluid and fetal movement were reviewed in detail with the patient. ?Please refer to After Visit Summary for other counseling recommendations.  ? ?Return in about 1 week (around 03/25/2022) for rob. ? ?Tresea Mall, CNM ?03/18/2022 2:32 PM   ? ?

## 2022-03-18 NOTE — Patient Instructions (Signed)
Labor Induction ?Labor induction is when steps are taken to cause a pregnant woman to begin the labor process. Most women go into labor on their own between 37 weeks and 42 weeks of pregnancy. When this does not happen, or when there is a medical need for labor to begin, steps may be taken to induce, or bring on, labor. ?Labor induction causes a pregnant woman's uterus to contract. It also causes the cervix to soften (ripen), open (dilate), and thin out. Usually, labor is not induced before 39 weeks of pregnancy unless there is a medical reason to do so. ?When is labor induction considered? ?Labor induction may be right for you if: ?Your pregnancy lasts longer than 41 to 42 weeks. ?Your placenta is separating from your uterus (placental abruption). ?You have a rupture of membranes and your labor does not begin. ?You have health problems, like diabetes or high blood pressure (preeclampsia) during your pregnancy. ?Your baby has stopped growing or does not have enough amniotic fluid. ?Before labor induction begins, your health care provider will consider the following factors: ?Your medical condition and the baby's condition. ?How many weeks you have been pregnant. ?How mature the baby's lungs are. ?The condition of your cervix. ?The position of the baby. ?The size of your birth canal. ?Tell a health care provider about: ?Any allergies you have. ?All medicines you are taking, including vitamins, herbs, eye drops, creams, and over-the-counter medicines. ?Any problems you or your family members have had with anesthetic medicines. ?Any surgeries you have had. ?Any blood disorders you have. ?Any medical conditions you have. ?What are the risks? ?Generally, this is a safe procedure. However, problems may occur, including: ?Failed induction. ?Changes in fetal heart rate, such as being too high, too low, or irregular (erratic). ?Infection in the mother or the baby. ?Increased risk of having a cesarean delivery. ?Breaking off  (abruption) of the placenta from the uterus. This is rare. ?Rupture of the uterus. This is very rare. ?Your baby could fail to get enough blood flow or oxygen. This can be life-threatening. ?When induction is needed for medical reasons, the benefits generally outweigh the risks. ?What happens during the procedure? ?During the procedure, your health care provider will use one of these methods to induce labor: ?Stripping the membranes. In this method, the amniotic sac tissue is gently separated from the cervix. This causes the following to happen: ?Your cervix stretches, which in turn causes the release of prostaglandins. ?Prostaglandins induce labor and cause the uterus to contract. ?This procedure is often done in an office visit. You will be sent home to wait for contractions to begin. ?Prostaglandin medicine. This medicine starts contractions and causes the cervix to dilate and ripen. This can be taken by mouth (orally) or by being inserted into the vagina (suppository). ?Inserting a small, thin tube (catheter) with a balloon into the vagina and then expanding the balloon with water to dilate the cervix. ?Breaking the water. In this method, a small instrument is used to make a small hole in the amniotic sac. This eventually causes the amniotic sac to break. Contractions should begin within a few hours. ?Medicine to trigger or strengthen contractions. This medicine is given through an IV that is inserted into a vein in your arm. ?This procedure may vary among health care providers and hospitals. ?Where to find more information ?[employer] of [employer]: www.marchofdimes.org ?[employer]: www.acog.org ?Summary ?Labor induction causes a pregnant woman's uterus to contract. It also causes the cervix  to soften (ripen), open (dilate), and thin out. ?Labor is usually not induced before 39 weeks of pregnancy unless there is a medical reason to do so. ?When induction is needed for medical  reasons, the benefits generally outweigh the risks. ?Talk with your health care provider about which methods of labor induction are right for you. ?This information is not intended to replace advice given to you by your health care provider. Make sure you discuss any questions you have with your health care provider. ?Document Revised: 08/17/2020 Document Reviewed: 08/17/2020 ?Elsevier Patient Education ? 2023 Else[employer] ?Signs and Symptoms of Labor ?Labor is the body's natural process of moving the baby and the placenta out of the uterus. The process of labor usually starts when the baby is full-term, between 93 and 41 weeks of pregnancy. ?Signs and symptoms that you are close to going into labor ?As your body prepares for labor and the birth of your baby, you may notice the following symptoms in the weeks and days before true labor starts: ?Passing a small amount of thick, bloody mucus from your vagina. This is called normal bloody show or losing your mucus plug. This may happen more than a week before labor begins, or right before labor begins, as the opening of the cervix starts to widen (dilate). For some women, the entire mucus plug passes at once. For others, pieces of the mucus plug may gradually pass over several days. ?Your baby moving (dropping) lower in your pelvis to get into position for birth (lightening). When this happens, you may feel more pressure on your bladder and pelvic bone and less pressure on your ribs. This may make it easier to breathe. It may also cause you to need to urinate more often and have problems with bowel movements. ?Having "practice contractions," also called Braxton Hicks contractions or false labor. These occur at irregular (unevenly spaced) intervals that are more than 10 minutes apart. False labor contractions are common after exercise or sexual activity. They will stop if you change position, rest, or drink fluids. These contractions are usually mild and do not get  stronger over time. They may feel like: ?A backache or back pain. ?Mild cramps, similar to menstrual cramps. ?Tightening or pressure in your abdomen. ?Other early symptoms include: ?Nausea or loss of appetite. ?Diarrhea. ?Having a sudden burst of energy, or feeling very tired. ?Mood changes. ?Having trouble sleeping. ?Signs and symptoms that labor has begun ?Signs that you are in labor may include: ?Having contractions that come at regular (evenly spaced) intervals and increase in intensity. This may feel like more intense tightening or pressure in your abdomen that moves to your back. ?Contractions may also feel like rhythmic pain in your upper thighs or back that comes and goes at regular intervals. ?If you are delivering for the first time, this change in intensity of contractions often occurs at a more gradual pace. ?If you have given birth before, you may notice a more rapid progression of contraction changes. ?Feeling pressure in the vaginal area. ?Your water breaking (rupture of membranes). This is when the sac of fluid that surrounds your baby breaks. Fluid leaking from your vagina may be clear or blood-tinged. Labor usually starts within 24 hours of your water breaking, but it may take longer to begin. ?Some people may feel a sudden gush of fluid; others may notice repeatedly damp underwear. ?Follow these instructions at home: ? ?When labor starts, or if your water breaks, call your health care provider or nurse  care line. Based on your situation, they will determine when you should go in for an exam. ?During early labor, you may be able to rest and manage symptoms at home. Some strategies to try at home include: ?Breathing and relaxation techniques. ?Taking a warm bath or shower. ?Listening to music. ?Using a heating pad on the lower back for pain. If directed, apply heat to the area as often as told by your health care provider. Use the heat source that your health care provider recommends, such as a  moist heat pack or a heating pad. ?Place a towel between your skin and the heat source. ?Leave the heat on for 20-30 minutes. ?Remove the heat if your skin turns bright red. This is especially important if y

## 2022-03-18 NOTE — Addendum Note (Signed)
Addended by: Inis Sizer on: 03/18/2022 02:50 PM ? ? Modules accepted: Orders ? ?

## 2022-03-21 ENCOUNTER — Other Ambulatory Visit: Payer: Self-pay

## 2022-03-21 ENCOUNTER — Encounter: Payer: Self-pay | Admitting: Obstetrics and Gynecology

## 2022-03-21 ENCOUNTER — Observation Stay
Admission: EM | Admit: 2022-03-21 | Discharge: 2022-03-21 | Disposition: A | Discharge status: Home / Self Care | Payer: Federal, State, Local not specified - PPO | Attending: Certified Nurse Midwife | Admitting: Certified Nurse Midwife

## 2022-03-21 DIAGNOSIS — Z3403 Encounter for supervision of normal first pregnancy, third trimester: Secondary | ICD-10-CM

## 2022-03-21 DIAGNOSIS — Z8679 Personal history of other diseases of the circulatory system: Secondary | ICD-10-CM

## 2022-03-21 DIAGNOSIS — R109 Unspecified abdominal pain: Secondary | ICD-10-CM | POA: Diagnosis not present

## 2022-03-21 DIAGNOSIS — O471 False labor at or after 37 completed weeks of gestation: Principal | ICD-10-CM | POA: Insufficient documentation

## 2022-03-21 DIAGNOSIS — Z3A38 38 weeks gestation of pregnancy: Secondary | ICD-10-CM | POA: Diagnosis not present

## 2022-03-21 DIAGNOSIS — O26893 Other specified pregnancy related conditions, third trimester: Secondary | ICD-10-CM | POA: Diagnosis not present

## 2022-03-21 NOTE — OB Triage Note (Signed)
Pt is a 25y/o G1P0 at [redacted]w[redacted]d with c/o contractions that began two days ago that have gotten worse. Pt states +FM. Pt denies LOF and VB. Monitors applied and assessing. Initial FHT 160.  ?

## 2022-03-21 NOTE — Progress Notes (Signed)
Give patient discharge instructions, patient verbalizes understanding. Patient ambulatory home with significant other in good condition.  ?

## 2022-03-21 NOTE — OB Triage Note (Signed)
? ? ?  L&D OB Triage Note ? ?SUBJECTIVE ?Holly Wu is a 26 y.o. G1P0000 female at [redacted]w[redacted]d, EDD Estimated Date of Delivery: 04/01/22 who presented to triage with complaints of contractions. She feels good fetal movement, denies loss of fluid and vaginal bleeding.  ? ?OB History  ?Gravida Para Term Preterm AB Living  ?1 0 0 0 0 0  ?SAB IAB Ectopic Multiple Live Births  ?0 0 0 0 0  ?  ?# Outcome Date GA Lbr Len/2nd Weight Sex Delivery Anes PTL Lv  ?1 Current           ? ? ?Medications Prior to Admission  ?Medication Sig Dispense Refill Last Dose  ? Prenatal Vit-Fe Fumarate-FA (MULTIVITAMIN-PRENATAL) 27-0.8 MG TABS tablet Take 1 tablet by mouth daily at 12 noon.   03/20/2022  ? ? ? OBJECTIVE ? Nursing Evaluation: ?  BP 126/82 (BP Location: Right Arm)   Pulse 94   Temp 98.3 ?F (36.8 ?C) (Oral)   Resp 20   Ht 5\' 2"  (1.575 m)   Wt 96.6 kg   LMP  (LMP Unknown)   BMI 38.96 kg/m?  ?  Findings:   reactive NST ?    ? ?NST was performed and has been reviewed by me. ? ?NST INTERPRETATION: ?Category I ? ?Mode: External ?Baseline Rate (A): 140 bpm ?Variability: Moderate ?Accelerations: 15 x 15 ?Decelerations: None ?  ?  ?Contraction Frequency (min): 4-7.5 ? ?ASSESSMENT ?Impression:  ?1.  Pregnancy:  G1P0000 at [redacted]w[redacted]d , EDD Estimated Date of Delivery: 04/01/22 ?2.  Reassuring fetal and maternal status ?3.  No cervical change  ? ?PLAN ?1. Current condition and above findings reviewed.  Reassuring fetal and maternal condition. ?2. Discharge home with standard labor precautions given to return to L&D or call the office for problems. ?3. Continue routine prenatal care. ?    ?Philip Aspen, CNM  ?

## 2022-03-22 ENCOUNTER — Telehealth: Payer: Self-pay

## 2022-03-25 NOTE — Telephone Encounter (Signed)
Transition Care Management Unsuccessful Follow-up Telephone Call ? ?Date of discharge and from where:  03/21/2022-[facility] ? ?Attempts:  2nd Attempt ? ?Reason for unsuccessful TCM follow-up call:  Left voice message ? ? ? ?

## 2022-03-26 NOTE — Telephone Encounter (Signed)
Transition Care Management Unsuccessful Follow-up Telephone Call ? ?Date of discharge and from where:  03/21/2022-[facility] ? ?Attempts:  3rd Attempt ? ?Reason for unsuccessful TCM follow-up call:  Unable to reach patient ? ? ? ?

## 2022-03-27 ENCOUNTER — Encounter: Payer: Self-pay | Admitting: Obstetrics and Gynecology

## 2022-03-27 ENCOUNTER — Ambulatory Visit (INDEPENDENT_AMBULATORY_CARE_PROVIDER_SITE_OTHER): Payer: Federal, State, Local not specified - PPO | Admitting: Obstetrics and Gynecology

## 2022-03-27 VITALS — BP 120/80 | Wt 215.0 lb

## 2022-03-27 DIAGNOSIS — Z3403 Encounter for supervision of normal first pregnancy, third trimester: Secondary | ICD-10-CM

## 2022-03-27 NOTE — Addendum Note (Signed)
Addended by: Cornelius Moras D on: 03/27/2022 04:02 PM ? ? Modules accepted: Orders ? ?

## 2022-03-27 NOTE — Progress Notes (Signed)
? ?  PRENATAL VISIT NOTE ? ?Subjective:  ?Holly Wu is a 26 y.o. G1P0000 at [redacted]w[redacted]d being seen today for ongoing prenatal care.  She is currently monitored for the following issues for this low-risk pregnancy and has Secondary oligomenorrhea; Polycystic ovary syndrome; Supervision of normal pregnancy; History of right bundle branch block; and Labor and delivery, indication for care on their problem list. ? ?Patient reports no complaints.  Contractions: Irritability. Vag. Bleeding: None.  Movement: Present. Denies leaking of fluid.  ? ?The following portions of the patient's history were reviewed and updated as appropriate: allergies, current medications, past family history, past medical history, past social history, past surgical history and problem list.  ? ?Objective:  ? ?Vitals:  ? 03/27/22 1029  ?BP: 120/80  ?Weight: 215 lb (97.5 kg)  ? ? ?Fetal Status: Fetal Heart Rate (bpm): 145 Fundal Height: 40 cm Movement: Present    ? ?General:  Alert, oriented and cooperative. Patient is in no acute distress.  ?Skin: Skin is warm and dry. No rash noted.   ?Cardiovascular: Normal heart rate noted  ?Respiratory: Normal respiratory effort, no problems with respiration noted  ?Abdomen: Soft, gravid, appropriate for gestational age.  Pain/Pressure: Present     ?Pelvic: Cervical exam deferred        ?Extremities: Normal range of motion.     ?Mental Status: Normal mood and affect. Normal behavior. Normal judgment and thought content.  ? ?Assessment and Plan:  ?Pregnancy: G1P0000 at [redacted]w[redacted]d ?1. Encounter for supervision of normal first pregnancy in third trimester ?Patient is doing well without complaints ?Patient has found a pediatrician ?She plans birth control pills for contraception ? ?Term labor symptoms and general obstetric precautions including but not limited to vaginal bleeding, contractions, leaking of fluid and fetal movement were reviewed in detail with the patient. ?Please refer to After Visit Summary for  other counseling recommendations.  ? ?Return in about 1 week (around 04/03/2022) for in person, ROB, Low risk. ? ?No future appointments. ? ?Catalina Antigua, MD ? ?

## 2022-03-29 NOTE — Progress Notes (Signed)
Order(s) created erroneously. Erroneous order ID: [id number] ? Order canceled by: CHART CORRECTION ANALYST NINE, IDENTITY ? Order cancel date/time: 03/29/2022 5:58 AM ?

## 2022-04-01 ENCOUNTER — Inpatient Hospital Stay: Admit: 2022-04-01 | Payer: Self-pay

## 2022-04-03 ENCOUNTER — Ambulatory Visit (INDEPENDENT_AMBULATORY_CARE_PROVIDER_SITE_OTHER): Payer: Federal, State, Local not specified - PPO | Admitting: Licensed Practical Nurse

## 2022-04-03 VITALS — BP 122/72 | Wt 217.0 lb

## 2022-04-03 DIAGNOSIS — Z3A40 40 weeks gestation of pregnancy: Secondary | ICD-10-CM

## 2022-04-03 DIAGNOSIS — Z3490 Encounter for supervision of normal pregnancy, unspecified, unspecified trimester: Secondary | ICD-10-CM

## 2022-04-03 DIAGNOSIS — Z3403 Encounter for supervision of normal first pregnancy, third trimester: Secondary | ICD-10-CM

## 2022-04-03 DIAGNOSIS — O26849 Uterine size-date discrepancy, unspecified trimester: Secondary | ICD-10-CM

## 2022-04-03 LAB — FETAL NONSTRESS TEST

## 2022-04-03 LAB — POCT URINALYSIS DIPSTICK OB
Glucose, UA: NEGATIVE
POC,PROTEIN,UA: NEGATIVE

## 2022-04-04 ENCOUNTER — Encounter: Payer: Self-pay | Admitting: Licensed Practical Nurse

## 2022-04-04 ENCOUNTER — Observation Stay
Admission: RE | Admit: 2022-04-04 | Discharge: 2022-04-04 | Disposition: A | Discharge status: Home / Self Care | Payer: Federal, State, Local not specified - PPO | Source: Ambulatory Visit | Attending: Licensed Practical Nurse | Admitting: Licensed Practical Nurse

## 2022-04-04 DIAGNOSIS — O26843 Uterine size-date discrepancy, third trimester: Secondary | ICD-10-CM | POA: Insufficient documentation

## 2022-04-04 DIAGNOSIS — D509 Iron deficiency anemia, unspecified: Secondary | ICD-10-CM | POA: Diagnosis not present

## 2022-04-04 DIAGNOSIS — Z3A40 40 weeks gestation of pregnancy: Secondary | ICD-10-CM | POA: Insufficient documentation

## 2022-04-04 DIAGNOSIS — O480 Post-term pregnancy: Secondary | ICD-10-CM | POA: Diagnosis not present

## 2022-04-04 DIAGNOSIS — O411230 Chorioamnionitis, third trimester, not applicable or unspecified: Secondary | ICD-10-CM | POA: Diagnosis not present

## 2022-04-04 DIAGNOSIS — O26849 Uterine size-date discrepancy, unspecified trimester: Secondary | ICD-10-CM

## 2022-04-04 DIAGNOSIS — Z3689 Encounter for other specified antenatal screening: Secondary | ICD-10-CM | POA: Diagnosis not present

## 2022-04-04 DIAGNOSIS — O4292 Full-term premature rupture of membranes, unspecified as to length of time between rupture and onset of labor: Secondary | ICD-10-CM | POA: Diagnosis not present

## 2022-04-04 DIAGNOSIS — Z3403 Encounter for supervision of normal first pregnancy, third trimester: Secondary | ICD-10-CM

## 2022-04-04 DIAGNOSIS — O9902 Anemia complicating childbirth: Secondary | ICD-10-CM | POA: Diagnosis not present

## 2022-04-04 DIAGNOSIS — O3663X0 Maternal care for excessive fetal growth, third trimester, not applicable or unspecified: Secondary | ICD-10-CM | POA: Diagnosis not present

## 2022-04-05 ENCOUNTER — Other Ambulatory Visit: Payer: Self-pay

## 2022-04-05 ENCOUNTER — Observation Stay (HOSPITAL_BASED_OUTPATIENT_CLINIC_OR_DEPARTMENT_OTHER)
Admission: EM | Admit: 2022-04-05 | Discharge: 2022-04-06 | Disposition: A | Discharge status: Home / Self Care | Payer: Federal, State, Local not specified - PPO | Source: Home / Self Care | Admitting: Obstetrics

## 2022-04-05 ENCOUNTER — Encounter: Payer: Self-pay | Admitting: Obstetrics and Gynecology

## 2022-04-05 DIAGNOSIS — Z3A40 40 weeks gestation of pregnancy: Secondary | ICD-10-CM | POA: Insufficient documentation

## 2022-04-05 DIAGNOSIS — Z3403 Encounter for supervision of normal first pregnancy, third trimester: Secondary | ICD-10-CM

## 2022-04-05 DIAGNOSIS — Z8679 Personal history of other diseases of the circulatory system: Secondary | ICD-10-CM

## 2022-04-05 DIAGNOSIS — O471 False labor at or after 37 completed weeks of gestation: Secondary | ICD-10-CM | POA: Insufficient documentation

## 2022-04-05 DIAGNOSIS — O480 Post-term pregnancy: Secondary | ICD-10-CM | POA: Insufficient documentation

## 2022-04-05 NOTE — Progress Notes (Signed)
Routine Prenatal Care Visit  Subjective  Holly Wu is a 26 y.o. G1P0000 at [redacted]w[redacted]d being seen today for ongoing prenatal care.  She is currently monitored for the following issues for this low-risk pregnancy and has Secondary oligomenorrhea; Polycystic ovary syndrome; Supervision of normal pregnancy; History of right bundle branch block; and Labor and delivery, indication for care on their problem list.  ----------------------------------------------------------------------------------- Patient reports no complaints.  Here with partner  Contractions: Irritability. Vag. Bleeding: None.  Movement: Present. Leaking Fluid denies.  ----------------------------------------------------------------------------------- The following portions of the patient's history were reviewed and updated as appropriate: allergies, current medications, past family history, past medical history, past social history, past surgical history and problem list. Problem list updated.  Objective  Blood pressure 122/72, weight 217 lb (98.4 kg). Pregravid weight 151 lb (68.5 kg) Total Weight Gain 66 lb (29.9 kg) Urinalysis: Urine Protein Negative  Urine Glucose Negative  Fetal Status: Fetal Heart Rate (bpm): 145 Fundal Height: 44 cm Movement: Present    RNST baseline 145,moderate variability, pos accel, neg decel   General:  Alert, oriented and cooperative. Patient is in no acute distress.  Skin: Skin is warm and dry. No rash noted.   Cardiovascular: Normal heart rate noted  Respiratory: Normal respiratory effort, no problems with respiration noted  Abdomen: Soft, gravid, appropriate for gestational age. Pain/Pressure: Present     Pelvic:  Cervical exam performed Dilation: 2 Effacement (%): 60 Station: -1  Extremities: Normal range of motion.     Mental Status: Normal mood and affect. Normal behavior. Normal judgment and thought content.   Assessment   26 y.o. G1P0000 at [redacted]w[redacted]d by  04/01/2022, by Ultrasound  presenting for routine prenatal visit RNST Size greater than dates   Plan   pregnancy 1 Problems (from 10/31/21 to present)     Problem Noted Resolved   Supervision of normal pregnancy 10/31/2021 by Tresea Mall, CNM No   Overview Addendum 03/18/2022  2:46 PM by Mirna Mires, CNM     Nursing Staff Provider  Office Location  [facility] Dating  EDD by 19w u/s  Language  English Anatomy US  Complete/normal  Flu Vaccine   Genetic Screen  NIPS:   TDaP vaccine    Hgb A1C or  GTT Early : NA Third trimester : 71  Covid    LAB RESULTS   Rhogam  NA Blood Type A/Positive/-- (02/07 1502)   Feeding Plan breast Antibody Negative (02/07 1502)  Contraception pop Rubella <0.90 (02/07 1502)  Circumcision NA RPR Non Reactive (02/22 1546)   Pediatrician   HBsAg Negative (02/07 1502)   Support Person Josh HIV Non Reactive (02/22 1546)  Prenatal Classes  Varicella     GBS  (For PCN allergy, check sensitivities) 4/24negative  BTL Consent     VBAC Consent NA Pap  2020: LGSIL, 10/31/21:     Hgb Electro    Pelvis Tested NA CF      SMA               History of right bundle branch block 2017 by Tresea Mall, CNM No        Term labor symptoms and general obstetric precautions including but not limited to vaginal bleeding, contractions, leaking of fluid and fetal movement were reviewed in detail with the patient. Please refer to After Visit Summary for other counseling recommendations.   [place] ordered to check AFI/Growth  IOL 5/22 at 0800, orders placed   Carie Caddy, CNM  [facility], [facility]  04/05/22  9:31 AM

## 2022-04-05 NOTE — Progress Notes (Signed)
OB History & Physical   History of Present Illness:  Chief Complaint:   HPI:  Holly Wu is a 26 y.o. G1P0000 female at [redacted]w[redacted]d dated by L and 19 wk [place].  She presents to L&D for a postdates induction.  She started prenatal care around 16 wks and has had irregular care.  At her 81wk ROB she was measuring S >D, EFW is in the 83% with normal AFI.   +FM, no CTX, no LOF, no VB  Pregnancy Issues: 1. R bundle branch block, has not had cardiology work up  2. Abnormal pap, CIN 2,3  3. Scoliosis, had anesthesia consult  Maternal Medical History:   Past Medical History:  Diagnosis Date   EKG abnormalities    PCOS (polycystic ovarian syndrome)    Right bundle branch block 2017   Scoliosis     Past Surgical History:  Procedure Laterality Date   ESOPHAGUS SURGERY     foreign object removed    No Known Allergies  Prior to Admission medications   Medication Sig Start Date End Date Taking? Authorizing Provider  Prenatal Vit-Fe Fumarate-FA (MULTIVITAMIN-PRENATAL) 27-0.8 MG TABS tablet Take 1 tablet by mouth daily at 12 noon.   Yes [provider]     Prenatal care site: [facility]  Social History: She  reports that she has never smoked. She has never used smokeless tobacco. She reports that she does not drink alcohol and does not use drugs.  Family History: family history includes Breast cancer in her paternal grandmother; Breast cancer (age of onset: 61) in an other family member; Colon cancer in her maternal aunt and maternal grandfather; Diabetes in her mother; Healthy in her father.   Review of Systems: A full review of systems was performed and negative except as noted in the HPI.     Physical Exam:  Vital Signs: BP 122/72   Wt 217 lb (98.4 kg)   LMP  (LMP Unknown)   BMI 39.69 kg/m  General: no acute distress.  HEENT: normocephalic, atraumatic Heart: regular rate & rhythm.  No murmurs/rubs/gallops Lungs: clear to auscultation bilaterally, normal respiratory  effort Abdomen: soft, gravid, non-tender;  EFW: 9lbs Pelvic:   External: Normal external female genitalia  Cervix: Dilation: 2 / Effacement (%): 60 / Station: -1    Extremities: non-tender, symmetric, trace edema bilaterally.  Neurologic: Alert & oriented x 3.    No results found for this or any previous visit (from the past 24 hour(s)).  Pertinent Results:  Prenatal Labs: Blood type/Rh A positive  Antibody screen neg  Rubella Non immune  Varicella Non immune  RPR NR  HBsAg Neg  HIV NR  GC neg  Chlamydia neg  Genetic screening negative  1 hour GTT 71  3 hour GTT   GBS neg  Abnormal pap, colpo CIN2,3 needs colpo/LEEP 4 wks PP  SVE:  Dilation: 2 / Effacement (%): 60 / Station: -1    Cephalic by leopolds  US OB Follow Up  Result Date: 04/04/2022 CLINICAL DATA:  Evaluate fetal size EXAM: OBSTETRIC 14+ WK ULTRASOUND FOLLOW-UP COMPARISON:  11/14/2021 FINDINGS: Number of Fetuses: 1 Heart Rate:  131 bpm Movement: Yes Presentation: Cephalic Previa: No Placental Location: Anterior Amniotic Fluid (Subjective): Normal Amniotic Fluid (Objective): AFI = 8 cm FETAL BIOMETRY BPD: 9.61cm 39w 2d HC:   35.36cm 41w 2d AC:   37.28cm 41w 2d FL:   7.74cm 39w 4d Current Mean GA: 40w 0d [place] EDC: 04/04/2022 Assigned GA:  40w 3d Assigned EDC: 04/01/2022  Estimated Fetal Weight:  4,150g 83.5%ile FETAL ANATOMY Lateral Ventricles: Appears normal Thalami/CSP: Appears normal Posterior Fossa:  Previously seen Nuchal Region: Previously seen   NFT= N/A > 20 WKS Upper Lip: Previously seen Spine: Previously seen 4 Chamber Heart on Left: Appears normal LVOT: Previously seen RVOT: Previously seen Stomach on Left: Appears normal 3 Vessel Cord: Previously seen Cord Insertion site: Previously seen Kidneys: Appears normal Bladder: Appears normal Extremities: Previously seen Sex: Female Technically difficult due to: None IMPRESSION: 1. Single live intrauterine pregnancy as detailed above. Electronically Signed   By: Kathreen Devoid  M.D.   On: 04/04/2022 11:23    Assessment:  Holly Wu is a 26 y.o. G1P0000 female at [redacted]w[redacted]d with admitted for postdate induction.   Plan:  Admit to Labor & Delivery CBC, T&S, Clrs, IVF GBS  neg Consents obtained. Continuous efm/toco Induction method based on admission exam.  Pain management: aware of all options, will ask when desired.   ----- Roberto Scales, [place]

## 2022-04-05 NOTE — OB Triage Note (Signed)
Pt arrives with c/o ctx's since Friday AM which have increased in intensity throughout the day. Pt denies LOF or bleeding at this time.

## 2022-04-05 NOTE — Addendum Note (Signed)
Addended by: Carie Caddy on: 04/05/2022 02:07 PM   Modules accepted: Orders

## 2022-04-06 DIAGNOSIS — O480 Post-term pregnancy: Secondary | ICD-10-CM | POA: Diagnosis not present

## 2022-04-06 DIAGNOSIS — Z3A40 40 weeks gestation of pregnancy: Secondary | ICD-10-CM | POA: Diagnosis not present

## 2022-04-06 DIAGNOSIS — O26893 Other specified pregnancy related conditions, third trimester: Secondary | ICD-10-CM | POA: Diagnosis not present

## 2022-04-06 DIAGNOSIS — R109 Unspecified abdominal pain: Secondary | ICD-10-CM | POA: Diagnosis not present

## 2022-04-06 MED ORDER — ACETAMINOPHEN 325 MG PO TABS
650.0000 mg | ORAL_TABLET | ORAL | Status: DC | PRN
  Administered 2022-04-06: 650 mg via ORAL
  Filled 2022-04-06: qty 2

## 2022-04-06 MED ORDER — HYDROXYZINE HCL 50 MG PO TABS
50.0000 mg | ORAL_TABLET | Freq: Once | ORAL | Status: AC
  Administered 2022-04-06: 50 mg via ORAL
  Filled 2022-04-06: qty 1

## 2022-04-06 NOTE — OB Triage Note (Signed)
LABOR & DELIVERY OB TRIAGE NOTE  SUBJECTIVE  HPI Holly Wu is a 26 y.o. G1P0000 at [redacted]w[redacted]d who presents to Labor & Delivery for contractions that have gotten stronger over the last few hours. She denies LOF and vaginal bleeding and endorses good fetal movement.  OB History     Gravida  1   Para  0   Term  0   Preterm  0   AB  0   Living  0      SAB  0   IAB  0   Ectopic  0   Multiple  0   Live Births  0           Scheduled Meds: Continuous Infusions: PRN Meds:.acetaminophen  OBJECTIVE  BP 132/86 (BP Location: Left Arm)   Pulse (!) 111   Temp 98.5 F (36.9 C) (Oral)   Resp 19   Ht 5\' 2"  (1.575 m)   Wt 97.5 kg   LMP  (LMP Unknown)   BMI 39.32 kg/m   NST I reviewed the NST and it was reactive.  Baseline: 145 Variability: moderate Accelerations: present, 15x15 Decelerations:none Toco: q 3 min Category 1  Dilation: 2 Effacement (%): 60 Cervical Position: Anterior Station: -1 Presentation: Vertex Exam by:: [email or web address], RN   ASSESSMENT Impression  1) Pregnancy at G1P0000, [redacted]w[redacted]d, Estimated Date of Delivery: 04/01/22 2) Reassuring maternal/fetal status 3) Cervix unchanged - latent labor  PLAN  1) Discharge home with standard labor/return precautions 2) Encourage rest, hydration, warm shower/bath. May have hydroxyzine for rest if desired. 3) Return for scheduled IOL if no active labor before.  04/03/22, CNM

## 2022-04-07 ENCOUNTER — Inpatient Hospital Stay: Payer: Federal, State, Local not specified - PPO | Admitting: Anesthesiology

## 2022-04-07 ENCOUNTER — Encounter: Payer: Self-pay | Admitting: Obstetrics and Gynecology

## 2022-04-07 ENCOUNTER — Inpatient Hospital Stay
Admission: EM | Admit: 2022-04-07 | Discharge: 2022-04-11 | DRG: 786 | Disposition: A | Discharge status: Home / Self Care | Payer: Federal, State, Local not specified - PPO | Attending: Obstetrics and Gynecology | Admitting: Obstetrics and Gynecology

## 2022-04-07 ENCOUNTER — Other Ambulatory Visit: Payer: Self-pay

## 2022-04-07 DIAGNOSIS — O480 Post-term pregnancy: Secondary | ICD-10-CM | POA: Diagnosis not present

## 2022-04-07 DIAGNOSIS — Z01810 Encounter for preprocedural cardiovascular examination: Secondary | ICD-10-CM | POA: Diagnosis not present

## 2022-04-07 DIAGNOSIS — Z3A40 40 weeks gestation of pregnancy: Secondary | ICD-10-CM

## 2022-04-07 DIAGNOSIS — O4292 Full-term premature rupture of membranes, unspecified as to length of time between rupture and onset of labor: Principal | ICD-10-CM | POA: Diagnosis present

## 2022-04-07 DIAGNOSIS — O9902 Anemia complicating childbirth: Secondary | ICD-10-CM | POA: Diagnosis present

## 2022-04-07 DIAGNOSIS — O3663X0 Maternal care for excessive fetal growth, third trimester, not applicable or unspecified: Secondary | ICD-10-CM | POA: Diagnosis present

## 2022-04-07 DIAGNOSIS — Z3490 Encounter for supervision of normal pregnancy, unspecified, unspecified trimester: Secondary | ICD-10-CM | POA: Diagnosis present

## 2022-04-07 DIAGNOSIS — D62 Acute posthemorrhagic anemia: Secondary | ICD-10-CM | POA: Diagnosis not present

## 2022-04-07 DIAGNOSIS — D509 Iron deficiency anemia, unspecified: Secondary | ICD-10-CM | POA: Diagnosis present

## 2022-04-07 DIAGNOSIS — Z8679 Personal history of other diseases of the circulatory system: Secondary | ICD-10-CM

## 2022-04-07 DIAGNOSIS — O34211 Maternal care for low transverse scar from previous cesarean delivery: Secondary | ICD-10-CM | POA: Diagnosis not present

## 2022-04-07 DIAGNOSIS — Z3403 Encounter for supervision of normal first pregnancy, third trimester: Secondary | ICD-10-CM

## 2022-04-07 DIAGNOSIS — O4202 Full-term premature rupture of membranes, onset of labor within 24 hours of rupture: Secondary | ICD-10-CM | POA: Diagnosis not present

## 2022-04-07 DIAGNOSIS — O411230 Chorioamnionitis, third trimester, not applicable or unspecified: Secondary | ICD-10-CM | POA: Diagnosis present

## 2022-04-07 DIAGNOSIS — O9903 Anemia complicating the puerperium: Secondary | ICD-10-CM | POA: Diagnosis not present

## 2022-04-07 DIAGNOSIS — Z3A00 Weeks of gestation of pregnancy not specified: Secondary | ICD-10-CM | POA: Diagnosis not present

## 2022-04-07 DIAGNOSIS — O479 False labor, unspecified: Secondary | ICD-10-CM | POA: Diagnosis present

## 2022-04-07 LAB — ABO/RH: ABO/RH(D): A POS

## 2022-04-07 LAB — CBC
HCT: 33.9 % — ABNORMAL LOW (ref 36.0–46.0)
Hemoglobin: 10.6 g/dL — ABNORMAL LOW (ref 12.0–15.0)
MCH: 24.4 pg — ABNORMAL LOW (ref 26.0–34.0)
MCHC: 31.3 g/dL (ref 30.0–36.0)
MCV: 77.9 fL — ABNORMAL LOW (ref 80.0–100.0)
Platelets: 260 10*3/uL (ref 150–400)
RBC: 4.35 MIL/uL (ref 3.87–5.11)
RDW: 15 % (ref 11.5–15.5)
WBC: 19.2 10*3/uL — ABNORMAL HIGH (ref 4.0–10.5)
nRBC: 0 % (ref 0.0–0.2)

## 2022-04-07 LAB — TYPE AND SCREEN
ABO/RH(D): A POS
Antibody Screen: NEGATIVE

## 2022-04-07 LAB — RAPID HIV SCREEN (HIV 1/2 AB+AG)
HIV 1/2 Antibodies: NONREACTIVE
HIV-1 P24 Antigen - HIV24: NONREACTIVE

## 2022-04-07 LAB — CREATININE, SERUM
Creatinine, Ser: 0.71 mg/dL (ref 0.44–1.00)
GFR, Estimated: >60 mL/min (ref >60)

## 2022-04-07 MED ORDER — DIPHENHYDRAMINE HCL 50 MG/ML IJ SOLN: 12.5000 mg | INTRAMUSCULAR | Status: DC | PRN

## 2022-04-07 MED ORDER — GENTAMICIN SULFATE 40 MG/ML IJ SOLN
5.0000 mg/kg | Freq: Once | INTRAVENOUS | Status: AC
  Administered 2022-04-07: 350 mg via INTRAVENOUS
  Filled 2022-04-07: qty 8.75

## 2022-04-07 MED ORDER — LIDOCAINE-EPINEPHRINE (PF) 1.5 %-1:200000 IJ SOLN
INTRAMUSCULAR | Status: DC | PRN
  Administered 2022-04-07: 3 mL via EPIDURAL

## 2022-04-07 MED ORDER — BUTORPHANOL TARTRATE 1 MG/ML IJ SOLN
1.0000 mg | INTRAMUSCULAR | Status: DC | PRN
  Administered 2022-04-07 (×2): 1 mg via INTRAVENOUS
  Filled 2022-04-07 (×2): qty 1

## 2022-04-07 MED ORDER — PHENYLEPHRINE 80 MCG/ML (10ML) SYRINGE FOR IV PUSH (FOR BLOOD PRESSURE SUPPORT): 80.0000 ug | PREFILLED_SYRINGE | INTRAVENOUS | Status: DC | PRN

## 2022-04-07 MED ORDER — MISOPROSTOL 25 MCG QUARTER TABLET: 25.0000 ug | ORAL_TABLET | ORAL | Status: DC | PRN

## 2022-04-07 MED ORDER — EPHEDRINE 5 MG/ML INJ: 10.0000 mg | INTRAVENOUS | Status: DC | PRN

## 2022-04-07 MED ORDER — ONDANSETRON HCL 4 MG/2ML IJ SOLN
4.0000 mg | Freq: Four times a day (QID) | INTRAMUSCULAR | Status: DC | PRN
  Administered 2022-04-07 – 2022-04-08 (×2): 4 mg via INTRAVENOUS
  Filled 2022-04-07 (×2): qty 2

## 2022-04-07 MED ORDER — OXYTOCIN-SODIUM CHLORIDE 30-0.9 UT/500ML-% IV SOLN: 2.5000 [IU]/h | INTRAVENOUS | Status: DC

## 2022-04-07 MED ORDER — OXYCODONE-ACETAMINOPHEN 5-325 MG PO TABS: 2.0000 | ORAL_TABLET | ORAL | Status: DC | PRN

## 2022-04-07 MED ORDER — TERBUTALINE SULFATE 1 MG/ML IJ SOLN: 0.2500 mg | Freq: Once | INTRAMUSCULAR | Status: DC | PRN

## 2022-04-07 MED ORDER — ACETAMINOPHEN 500 MG PO TABS
1000.0000 mg | ORAL_TABLET | Freq: Four times a day (QID) | ORAL | Status: DC | PRN
  Administered 2022-04-07 – 2022-04-08 (×2): 1000 mg via ORAL
  Filled 2022-04-07 (×2): qty 2

## 2022-04-07 MED ORDER — OXYTOCIN 10 UNIT/ML IJ SOLN
INTRAMUSCULAR | Status: AC
  Filled 2022-04-07: qty 2

## 2022-04-07 MED ORDER — LIDOCAINE HCL (PF) 1 % IJ SOLN
INTRAMUSCULAR | Status: DC | PRN
  Administered 2022-04-07: 3 mL via SUBCUTANEOUS

## 2022-04-07 MED ORDER — ACETAMINOPHEN 325 MG PO TABS: 650.0000 mg | ORAL_TABLET | ORAL | Status: DC | PRN

## 2022-04-07 MED ORDER — BUTORPHANOL TARTRATE 1 MG/ML IJ SOLN: 1.0000 mg | INTRAMUSCULAR | Status: DC | PRN

## 2022-04-07 MED ORDER — SODIUM CHLORIDE 0.9 % IV SOLN
INTRAVENOUS | Status: DC | PRN
  Administered 2022-04-07 (×2): 5 mL via EPIDURAL

## 2022-04-07 MED ORDER — ONDANSETRON HCL 4 MG/2ML IJ SOLN: 4.0000 mg | Freq: Four times a day (QID) | INTRAMUSCULAR | Status: DC | PRN

## 2022-04-07 MED ORDER — LACTATED RINGERS IV SOLN: INTRAVENOUS | Status: DC

## 2022-04-07 MED ORDER — MISOPROSTOL 200 MCG PO TABS
ORAL_TABLET | ORAL | Status: DC
Start: 2022-04-07 — End: 2022-04-07
  Filled 2022-04-07: qty 4

## 2022-04-07 MED ORDER — AMMONIA AROMATIC IN INHA
RESPIRATORY_TRACT | Status: AC
  Filled 2022-04-07: qty 10

## 2022-04-07 MED ORDER — OXYTOCIN-SODIUM CHLORIDE 30-0.9 UT/500ML-% IV SOLN
2.5000 [IU]/h | INTRAVENOUS | Status: DC
  Administered 2022-04-08: 30 [IU] via INTRAVENOUS
  Filled 2022-04-07: qty 500

## 2022-04-07 MED ORDER — SOD CITRATE-CITRIC ACID 500-334 MG/5ML PO SOLN: 30.0000 mL | ORAL | Status: DC | PRN

## 2022-04-07 MED ORDER — OXYCODONE-ACETAMINOPHEN 5-325 MG PO TABS: 1.0000 | ORAL_TABLET | ORAL | Status: DC | PRN

## 2022-04-07 MED ORDER — FENTANYL-BUPIVACAINE-NACL 0.5-0.125-0.9 MG/250ML-% EP SOLN
12.0000 mL/h | EPIDURAL | Status: DC | PRN
  Administered 2022-04-07: 12 mL/h via EPIDURAL
  Filled 2022-04-07: qty 250

## 2022-04-07 MED ORDER — OXYTOCIN BOLUS FROM INFUSION: 333.0000 mL | Freq: Once | INTRAVENOUS | Status: DC

## 2022-04-07 MED ORDER — LIDOCAINE HCL (PF) 1 % IJ SOLN
INTRAMUSCULAR | Status: AC
  Filled 2022-04-07: qty 30

## 2022-04-07 MED ORDER — OXYTOCIN-SODIUM CHLORIDE 30-0.9 UT/500ML-% IV SOLN
1.0000 m[IU]/min | INTRAVENOUS | Status: DC
  Administered 2022-04-07: 2 m[IU]/min via INTRAVENOUS
  Filled 2022-04-07: qty 500

## 2022-04-07 MED ORDER — LIDOCAINE HCL (PF) 1 % IJ SOLN: 30.0000 mL | INTRAMUSCULAR | Status: AC | PRN

## 2022-04-07 MED ORDER — SODIUM CHLORIDE 0.9 % IV SOLN
2.0000 g | Freq: Four times a day (QID) | INTRAVENOUS | Status: DC
  Administered 2022-04-07: 2 g via INTRAVENOUS

## 2022-04-07 MED ORDER — SOD CITRATE-CITRIC ACID 500-334 MG/5ML PO SOLN
30.0000 mL | ORAL | Status: DC | PRN
  Administered 2022-04-08: 30 mL via ORAL

## 2022-04-07 MED ORDER — LACTATED RINGERS IV SOLN
500.0000 mL | INTRAVENOUS | Status: DC | PRN
  Administered 2022-04-08: 500 mL via INTRAVENOUS

## 2022-04-07 MED ORDER — LIDOCAINE HCL (PF) 1 % IJ SOLN: 30.0000 mL | INTRAMUSCULAR | Status: DC | PRN

## 2022-04-07 MED ORDER — OXYTOCIN-SODIUM CHLORIDE 30-0.9 UT/500ML-% IV SOLN
INTRAVENOUS | Status: AC
  Filled 2022-04-07: qty 500

## 2022-04-07 MED ORDER — AMPICILLIN SODIUM 2 G IJ SOLR
INTRAMUSCULAR | Status: AC
  Filled 2022-04-07: qty 2000

## 2022-04-07 MED ORDER — LACTATED RINGERS IV SOLN: 500.0000 mL | INTRAVENOUS | Status: DC | PRN

## 2022-04-07 MED ORDER — LACTATED RINGERS IV SOLN
500.0000 mL | Freq: Once | INTRAVENOUS | Status: AC
  Administered 2022-04-07: 500 mL via INTRAVENOUS

## 2022-04-07 NOTE — Progress Notes (Signed)
Holly Wu is a 26 y.o. G1P0000 at [redacted]w[redacted]d by ultrasound admitted for rupture of membranes which occurred at 0100 this morning. She is supported by her husband and mother. She has rested for the last few hours after receiving a dose of IBV Stadol. Her labor is being augmented with pitocin.  Subjective: She is asking for epidural anesthesia. Feeling very nauseous.   Objective: BP 131/81 (BP Location: Left Arm)   Pulse 98   Temp 98 F (36.7 C) (Oral)   Resp 20   Ht 5\' 2"  (1.575 m)   Wt 97.5 kg   LMP  (LMP Unknown)   SpO2 100%   BMI 39.32 kg/m  No intake/output data recorded. No intake/output data recorded.  FHT:  FHR: 140 bpm, variability: moderate,  accelerations:  Present,  decelerations:  Present occasional, non repetitive variables UC:   regular, every 2-3.5 minutes SVE:   Dilation: 4 Effacement (%): 80 Station: -2 Exam by:: [email or web address] RN Suspect OP position. EFW>4000 gms.  Labs: Lab Results  Component Value Date   WBC 19.2 (H) 04/07/2022   HGB 10.6 (L) 04/07/2022   HCT 33.9 (L) 04/07/2022   MCV 77.9 (L) 04/07/2022   PLT 260 04/07/2022    Assessment / Plan: Augmentation of labor, progressing well  Labor:  Progressing on Pitocin, now 15 hours post SROM  Fetal Wellbeing:  Category I Pain Control:  IV pain meds, now desires epidural I/D:  n/a Anticipated MOD:  NSVD. Anticipate >8.5 lb baby. Will recheck once she has rested with epidural.  04/09/2022 04/07/2022, 4:23 PM

## 2022-04-07 NOTE — H&P (Signed)
Subjective:  Holly Wu is a 26 y.o. G1 P0 female with EDC 04/01/2022 at 40 and 6/[redacted] weeks gestation who is being admitted for SROM ( which occurred at 0100).  Her current obstetrical history is significant for late onset of prenatal care, high BMI, 60 lb weight gain, and a hx of R bundle block.She has had abnormal pap smears, CIN2,3 noted this pregnancy.  Patient reports backache, contractions since and ongoing leaking of fluid since earlier this morning at 0100., nausea, and pelvic pain .   Fetal Movement: normal.     Objective: She descirbes being woke up in the night by her LOF, and mild contractions began soon after. She is grimacing with her Ucs, and does not tolerate vaginal exams well.   Vital signs in last 24 hours: Temp:  [97.6 F (36.4 C)-98 F (36.7 C)] 98 F (36.7 C) (05/21 1515) Pulse Rate:  [98] 98 (05/21 1119) Resp:  [20] 20 (05/21 1119) BP: (131)/(81) 131/81 (05/21 1119) SpO2:  [100 %] 100 % (05/21 1119) Weight:  [97.5 kg] 97.5 kg (05/21 1119)   General:   cooperative, appears stated age, mild distress, and mildly obese  Skin:   normal and no rash or abnormalities  HEENT:  PERRLA  Lungs:   clear to auscultation bilaterally  Heart:   regular rate and rhythm, S1, S2 normal, no murmur, click, rub or gallop  Breasts:   normal without suspicious masses, skin or nipple changes or axillary nodes  Abdomen:   Gravid abdomen; adipose, + bowel sounds  Pelvis:  Vulva and vagina appear normal. Bimanual exam reveals normal uterus and adnexa. Cervix: exam:  3/80/-1  FHT:  140s BPM  Uterine Size: 39 cm  Presentations: cephalic. Confirmed with ultrasound  Cervix:    Dilation: 3cm   Effacement: 75%   Station:  -1   Consistency: medium   Position: anterior   Lab Review  A, Rh+, Rubella-non-immune, Hepatitis B surface antigen non-reactive, GBS negative  YKD:XIPJASN declined  One hour GTT: Normal    Assessment/Plan:  40 and 6/[redacted] weeks gestation. SROM and Early latent  labor. Obstetrical history significant for rubella non immune.     Risks, benefits, alternatives and possible complications have been discussed in detail with the patient.  Pre-admission, admission, and post admission procedures and expectations were discussed in detail.  All questions answered, all appropriate consents will be signed at the Hospital. Admission is planned for today.  Ambulate, monitored., Augmentation: IV Pitocin augmentation., and Intervention: increase Pitocin rate per protocol.  Mirna Mires, CNM  04/07/2022 4:48 PM

## 2022-04-07 NOTE — Anesthesia Preprocedure Evaluation (Addendum)
Anesthesia Evaluation  Patient identified by MRN, date of birth, ID band Patient awake    Reviewed: Allergy & Precautions, H&P , NPO status , Patient's Chart, lab work & pertinent test results, reviewed documented beta blocker date and time   History of Anesthesia Complications Negative for: history of anesthetic complications  Airway Mallampati: III  TM Distance: >3 FB Neck ROM: full    Dental no notable dental hx.    Pulmonary neg pulmonary ROS,    Pulmonary exam normal breath sounds clear to auscultation       Cardiovascular Exercise Tolerance: Good (-) hypertension(-) angina(-) Past MI and (-) Cardiac Stents Normal cardiovascular exam+ dysrhythmias (RBBB) + Valvular Problems/Murmurs  Rhythm:regular Rate:Normal     Neuro/Psych negative neurological ROS  negative psych ROS   GI/Hepatic Neg liver ROS, GERD  ,  Endo/Other  negative endocrine ROS  Renal/GU negative Renal ROS  negative genitourinary   Musculoskeletal   Abdominal   Peds  Hematology negative hematology ROS (+)   Anesthesia Other Findings Past Medical History: No date: EKG abnormalities No date: PCOS (polycystic ovarian syndrome) 2017: Right bundle branch block No date: Scoliosis   Reproductive/Obstetrics (+) Pregnancy                             Anesthesia Physical Anesthesia Plan  ASA: 2  Anesthesia Plan: Epidural   Post-op Pain Management:    Induction:   PONV Risk Score and Plan:   Airway Management Planned:   Additional Equipment:   Intra-op Plan:   Post-operative Plan:   Informed Consent: I have reviewed the patients History and Physical, chart, labs and discussed the procedure including the risks, benefits and alternatives for the proposed anesthesia with the patient or authorized representative who has indicated his/her understanding and acceptance.     Dental Advisory Given  Plan Discussed  with: Anesthesiologist, CRNA and Surgeon  Anesthesia Plan Comments:         Anesthesia Quick Evaluation

## 2022-04-07 NOTE — Consult Note (Signed)
Pharmacy Antibiotic Note  Holly Wu is a 26 y.o. female admitted on 04/07/2022 with  chorioamnionitis .  Pharmacy has been consulted for gentamicin dosing.  Plan: Gentamicin 350mg  IVPB once  (5mg /kg gentamicin recommended for intrapartum chorioamnionits)  Actual BW 97.5 IBW 50.1 kg Adjusted BW 69.1kg   *Baseline Scr ordered - most recent Scr 8.15 > 26 year old)  Will order gentamicin level 10 hrs after dose given to determine appropriate interval*  Height: 5\' 2"  (157.5 cm) Weight: 97.5 kg (215 lb) IBW/kg (Calculated) : 50.1  Temp (24hrs), Avg:98.4 F (36.9 C), Min:97.6 F (36.4 C), Max:101.3 F (38.5 C)  Recent Labs  Lab 04/07/22 1205  WBC 19.2*    CrCl cannot be calculated (No successful lab value found.).    No Known Allergies   Thank you for allowing pharmacy to be a part of this patient's care.  Lauramae Kneisley Rodriguez-Guzman PharmD, BCPS 04/07/2022 9:50 PM

## 2022-04-07 NOTE — Anesthesia Procedure Notes (Signed)
Epidural Patient location during procedure: OB Start time: 04/07/2022 4:52 PM End time: 04/07/2022 4:57 PM  Staffing Anesthesiologist: Lenard Simmer, MD Performed: anesthesiologist   Preanesthetic Checklist Completed: patient identified, IV checked, site marked, risks and benefits discussed, surgical consent, monitors and equipment checked, pre-op evaluation and timeout performed  Epidural Patient position: sitting Prep: ChloraPrep Patient monitoring: heart rate, continuous pulse ox and blood pressure Approach: midline Location: L3-L4 Injection technique: LOR saline  Needle:  Needle type: Tuohy  Needle gauge: 17 G Needle length: 9 cm and 9 Needle insertion depth: 6.5 cm Catheter type: closed end flexible Catheter size: 19 Gauge Catheter at skin depth: 11.5 cm Test dose: negative and 1.5% lidocaine with Epi 1:200 K  Assessment Sensory level: T10 Events: blood not aspirated, injection not painful, no injection resistance, no paresthesia and negative IV test  Additional Notes 1st attempt Pt. Evaluated and documentation done after procedure finished. Patient identified. Risks/Benefits/Options discussed with patient including but not limited to bleeding, infection, nerve damage, paralysis, failed block, incomplete pain control, headache, blood pressure changes, nausea, vomiting, reactions to medication both or allergic, itching and postpartum back pain. Confirmed with bedside nurse the patient's most recent platelet count. Confirmed with patient that they are not currently taking any anticoagulation, have any bleeding history or any family history of bleeding disorders. Patient expressed understanding and wished to proceed. All questions were answered. Sterile technique was used throughout the entire procedure. Please see nursing notes for vital signs. Test dose was given through epidural catheter and negative prior to continuing to dose epidural or start infusion. Warning signs of  high block given to the patient including shortness of breath, tingling/numbness in hands, complete motor block, or any concerning symptoms with instructions to call for help. Patient was given instructions on fall risk and not to get out of bed. All questions and concerns addressed with instructions to call with any issues or inadequate analgesia.    Patient tolerated the insertion well without immediate complications.Reason for block:procedure for pain

## 2022-04-07 NOTE — OB Triage Note (Signed)
Pt presents c/o ctx that she has had all weekend. Pt is unsure if she is LOF because she noticed it after she threw up and wasn't sure if it was urine or not. Pt denies bleeding. Pt reports positive fetal movement. VSS. Will continue to monitor.

## 2022-04-08 ENCOUNTER — Encounter: Payer: Self-pay | Admitting: Obstetrics

## 2022-04-08 ENCOUNTER — Encounter
Admission: EM | Disposition: A | Discharge status: Home / Self Care | Payer: Self-pay | Source: Home / Self Care | Attending: Obstetrics and Gynecology

## 2022-04-08 ENCOUNTER — Other Ambulatory Visit: Payer: Self-pay

## 2022-04-08 DIAGNOSIS — Z3A40 40 weeks gestation of pregnancy: Secondary | ICD-10-CM

## 2022-04-08 DIAGNOSIS — O34211 Maternal care for low transverse scar from previous cesarean delivery: Secondary | ICD-10-CM

## 2022-04-08 DIAGNOSIS — D62 Acute posthemorrhagic anemia: Secondary | ICD-10-CM

## 2022-04-08 DIAGNOSIS — O4202 Full-term premature rupture of membranes, onset of labor within 24 hours of rupture: Secondary | ICD-10-CM

## 2022-04-08 DIAGNOSIS — O480 Post-term pregnancy: Secondary | ICD-10-CM

## 2022-04-08 DIAGNOSIS — O9903 Anemia complicating the puerperium: Secondary | ICD-10-CM

## 2022-04-08 DIAGNOSIS — O411230 Chorioamnionitis, third trimester, not applicable or unspecified: Secondary | ICD-10-CM

## 2022-04-08 LAB — RPR: RPR Ser Ql: NONREACTIVE

## 2022-04-08 SURGERY — Surgical Case
Anesthesia: Epidural

## 2022-04-08 MED ORDER — FERROUS SULFATE 325 (65 FE) MG PO TABS
325.0000 mg | ORAL_TABLET | ORAL | Status: DC
  Administered 2022-04-09 – 2022-04-11 (×2): 325 mg via ORAL
  Filled 2022-04-08 (×2): qty 1

## 2022-04-08 MED ORDER — FENTANYL CITRATE (PF) 100 MCG/2ML IJ SOLN: 25.0000 ug | INTRAMUSCULAR | Status: DC | PRN

## 2022-04-08 MED ORDER — CEFAZOLIN SODIUM-DEXTROSE 2-4 GM/100ML-% IV SOLN
2.0000 g | INTRAVENOUS | Status: DC
  Filled 2022-04-08: qty 100

## 2022-04-08 MED ORDER — DIBUCAINE (PERIANAL) 1 % EX OINT: 1.0000 "application " | TOPICAL_OINTMENT | CUTANEOUS | Status: DC | PRN

## 2022-04-08 MED ORDER — OXYCODONE HCL 5 MG PO TABS
5.0000 mg | ORAL_TABLET | ORAL | Status: DC | PRN
  Administered 2022-04-09 – 2022-04-10 (×2): 10 mg via ORAL
  Filled 2022-04-08 (×2): qty 2

## 2022-04-08 MED ORDER — KETOROLAC TROMETHAMINE 30 MG/ML IJ SOLN: 30.0000 mg | Freq: Four times a day (QID) | INTRAMUSCULAR | Status: AC | PRN

## 2022-04-08 MED ORDER — NALOXONE HCL 0.4 MG/ML IJ SOLN: 0.4000 mg | INTRAMUSCULAR | Status: DC | PRN

## 2022-04-08 MED ORDER — BUPIVACAINE-MELOXICAM ER 200-6 MG/7ML IJ SOLN
INTRAMUSCULAR | Status: DC | PRN
Start: 2022-04-08 — End: 2022-04-08
  Administered 2022-04-08: 14 mL

## 2022-04-08 MED ORDER — OXYCODONE-ACETAMINOPHEN 5-325 MG PO TABS: 2.0000 | ORAL_TABLET | ORAL | Status: DC | PRN

## 2022-04-08 MED ORDER — MORPHINE SULFATE (PF) 0.5 MG/ML IJ SOLN
INTRAMUSCULAR | Status: AC
  Filled 2022-04-08: qty 10

## 2022-04-08 MED ORDER — METHYLERGONOVINE MALEATE 0.2 MG/ML IJ SOLN
INTRAMUSCULAR | Status: AC
  Filled 2022-04-08: qty 1

## 2022-04-08 MED ORDER — EPHEDRINE 5 MG/ML INJ
INTRAVENOUS | Status: AC
  Filled 2022-04-08: qty 5

## 2022-04-08 MED ORDER — DIPHENHYDRAMINE HCL 25 MG PO CAPS: 25.0000 mg | ORAL_CAPSULE | ORAL | Status: DC | PRN

## 2022-04-08 MED ORDER — PROPOFOL 10 MG/ML IV BOLUS
INTRAVENOUS | Status: DC | PRN
  Administered 2022-04-08: 140 mg via INTRAVENOUS

## 2022-04-08 MED ORDER — PRENATAL MULTIVITAMIN CH
1.0000 | ORAL_TABLET | Freq: Every day | ORAL | Status: DC
  Administered 2022-04-08 – 2022-04-10 (×3): 1 via ORAL
  Filled 2022-04-08 (×3): qty 1

## 2022-04-08 MED ORDER — GABAPENTIN 300 MG PO CAPS
300.0000 mg | ORAL_CAPSULE | Freq: Two times a day (BID) | ORAL | Status: DC
  Administered 2022-04-08 – 2022-04-11 (×7): 300 mg via ORAL
  Filled 2022-04-08 (×7): qty 1

## 2022-04-08 MED ORDER — VARICELLA VIRUS VACCINE LIVE 1350 PFU/0.5ML IJ SUSR
0.5000 mL | INTRAMUSCULAR | Status: DC | PRN
Start: 2022-04-08 — End: 2022-04-11
  Filled 2022-04-08: qty 0.5

## 2022-04-08 MED ORDER — ONDANSETRON HCL 4 MG/2ML IJ SOLN: 4.0000 mg | Freq: Three times a day (TID) | INTRAMUSCULAR | Status: DC | PRN

## 2022-04-08 MED ORDER — MEPERIDINE HCL 25 MG/ML IJ SOLN: 6.2500 mg | INTRAMUSCULAR | Status: DC | PRN

## 2022-04-08 MED ORDER — HYDROMORPHONE HCL 1 MG/ML IJ SOLN: 1.0000 mg | INTRAMUSCULAR | Status: DC | PRN

## 2022-04-08 MED ORDER — PROPOFOL 10 MG/ML IV BOLUS
INTRAVENOUS | Status: AC
  Filled 2022-04-08: qty 20

## 2022-04-08 MED ORDER — WITCH HAZEL-GLYCERIN EX PADS: 1.0000 "application " | MEDICATED_PAD | CUTANEOUS | Status: DC | PRN

## 2022-04-08 MED ORDER — ONDANSETRON HCL 4 MG/2ML IJ SOLN: 4.0000 mg | Freq: Once | INTRAMUSCULAR | Status: DC | PRN

## 2022-04-08 MED ORDER — SIMETHICONE 80 MG PO CHEW
80.0000 mg | CHEWABLE_TABLET | ORAL | Status: DC | PRN
  Administered 2022-04-09 – 2022-04-11 (×5): 80 mg via ORAL
  Filled 2022-04-08 (×5): qty 1

## 2022-04-08 MED ORDER — KETOROLAC TROMETHAMINE 30 MG/ML IJ SOLN
30.0000 mg | Freq: Four times a day (QID) | INTRAMUSCULAR | Status: AC
  Administered 2022-04-08 (×4): 30 mg via INTRAVENOUS
  Filled 2022-04-08 (×4): qty 1

## 2022-04-08 MED ORDER — FENTANYL CITRATE (PF) 100 MCG/2ML IJ SOLN
INTRAMUSCULAR | Status: DC | PRN
  Administered 2022-04-08: 100 ug via INTRAVENOUS

## 2022-04-08 MED ORDER — EPHEDRINE SULFATE (PRESSORS) 50 MG/ML IJ SOLN
INTRAMUSCULAR | Status: DC | PRN
  Administered 2022-04-08: 10 mg via INTRAVENOUS

## 2022-04-08 MED ORDER — SUCCINYLCHOLINE CHLORIDE 200 MG/10ML IV SOSY
PREFILLED_SYRINGE | INTRAVENOUS | Status: DC | PRN
  Administered 2022-04-08: 140 mg via INTRAVENOUS

## 2022-04-08 MED ORDER — COCONUT OIL OIL: 1.0000 "application " | TOPICAL_OIL | Status: DC | PRN

## 2022-04-08 MED ORDER — DIPHENHYDRAMINE HCL 25 MG PO CAPS: 25.0000 mg | ORAL_CAPSULE | Freq: Four times a day (QID) | ORAL | Status: DC | PRN

## 2022-04-08 MED ORDER — PHENYLEPHRINE HCL-NACL 20-0.9 MG/250ML-% IV SOLN
INTRAVENOUS | Status: AC
  Filled 2022-04-08: qty 250

## 2022-04-08 MED ORDER — MENTHOL 3 MG MT LOZG: 1.0000 | LOZENGE | OROMUCOSAL | Status: DC | PRN

## 2022-04-08 MED ORDER — ACETAMINOPHEN 500 MG PO TABS
1000.0000 mg | ORAL_TABLET | Freq: Four times a day (QID) | ORAL | Status: DC
  Administered 2022-04-08 – 2022-04-11 (×12): 1000 mg via ORAL
  Filled 2022-04-08 (×13): qty 2

## 2022-04-08 MED ORDER — LIDOCAINE HCL (CARDIAC) PF 100 MG/5ML IV SOSY
PREFILLED_SYRINGE | INTRAVENOUS | Status: DC | PRN
  Administered 2022-04-08: 40 mg via INTRAVENOUS

## 2022-04-08 MED ORDER — BUPIVACAINE-MELOXICAM ER 400-12 MG/14ML IJ SOLN
INTRAMUSCULAR | Status: AC
  Filled 2022-04-08: qty 1

## 2022-04-08 MED ORDER — IBUPROFEN 600 MG PO TABS
600.0000 mg | ORAL_TABLET | Freq: Four times a day (QID) | ORAL | Status: DC
  Administered 2022-04-09 – 2022-04-11 (×9): 600 mg via ORAL
  Filled 2022-04-08 (×9): qty 1

## 2022-04-08 MED ORDER — NALOXONE HCL 4 MG/10ML IJ SOLN
1.0000 ug/kg/h | INTRAVENOUS | Status: DC | PRN
  Filled 2022-04-08: qty 5

## 2022-04-08 MED ORDER — BUPIVACAINE-MELOXICAM ER 400-12 MG/14ML IJ SOLN: 300.0000 mg | Freq: Once | INTRAMUSCULAR | Status: DC

## 2022-04-08 MED ORDER — MORPHINE SULFATE (PF) 0.5 MG/ML IJ SOLN
INTRAMUSCULAR | Status: DC | PRN
  Administered 2022-04-08: 3 mg via EPIDURAL

## 2022-04-08 MED ORDER — DIPHENHYDRAMINE HCL 50 MG/ML IJ SOLN
12.5000 mg | INTRAMUSCULAR | Status: DC | PRN
Start: 2022-04-08 — End: 2022-04-11

## 2022-04-08 MED ORDER — ONDANSETRON HCL 4 MG/2ML IJ SOLN
INTRAMUSCULAR | Status: AC
  Filled 2022-04-08: qty 2

## 2022-04-08 MED ORDER — SUCCINYLCHOLINE CHLORIDE 200 MG/10ML IV SOSY
PREFILLED_SYRINGE | INTRAVENOUS | Status: AC
  Filled 2022-04-08: qty 10

## 2022-04-08 MED ORDER — SENNOSIDES-DOCUSATE SODIUM 8.6-50 MG PO TABS
2.0000 | ORAL_TABLET | Freq: Every day | ORAL | Status: DC
  Administered 2022-04-09 – 2022-04-10 (×2): 2 via ORAL
  Filled 2022-04-08 (×2): qty 2

## 2022-04-08 MED ORDER — LIDOCAINE HCL (PF) 2 % IJ SOLN
INTRAMUSCULAR | Status: DC | PRN
  Administered 2022-04-08 (×3): 100 mg via INTRADERMAL

## 2022-04-08 MED ORDER — ZOLPIDEM TARTRATE 5 MG PO TABS: 5.0000 mg | ORAL_TABLET | Freq: Every evening | ORAL | Status: DC | PRN

## 2022-04-08 MED ORDER — SODIUM CHLORIDE 0.9 % IV SOLN
500.0000 mg | INTRAVENOUS | Status: AC
  Administered 2022-04-08: 500 mg via INTRAVENOUS
  Filled 2022-04-08: qty 5

## 2022-04-08 MED ORDER — DEXAMETHASONE SODIUM PHOSPHATE 10 MG/ML IJ SOLN
INTRAMUSCULAR | Status: DC | PRN
  Administered 2022-04-08: 10 mg via INTRAVENOUS

## 2022-04-08 MED ORDER — MEASLES, MUMPS & RUBELLA VAC IJ SOLR
0.5000 mL | Freq: Once | INTRAMUSCULAR | Status: DC
Start: 2022-04-09 — End: 2022-04-11
  Filled 2022-04-08: qty 0.5

## 2022-04-08 MED ORDER — MAGNESIUM HYDROXIDE 400 MG/5ML PO SUSP: 30.0000 mL | ORAL | Status: DC | PRN

## 2022-04-08 MED ORDER — OXYCODONE HCL 5 MG PO TABS
5.0000 mg | ORAL_TABLET | Freq: Four times a day (QID) | ORAL | Status: DC | PRN
  Administered 2022-04-08: 5 mg via ORAL
  Filled 2022-04-08: qty 1

## 2022-04-08 MED ORDER — ONDANSETRON HCL 4 MG/2ML IJ SOLN
INTRAMUSCULAR | Status: DC | PRN
  Administered 2022-04-08: 4 mg via INTRAVENOUS

## 2022-04-08 MED ORDER — PHENYLEPHRINE HCL-NACL 20-0.9 MG/250ML-% IV SOLN
INTRAVENOUS | Status: DC | PRN
  Administered 2022-04-08: 30 ug/min via INTRAVENOUS

## 2022-04-08 MED ORDER — SODIUM CHLORIDE 0.9% FLUSH: 3.0000 mL | INTRAVENOUS | Status: DC | PRN

## 2022-04-08 MED ORDER — FENTANYL CITRATE (PF) 100 MCG/2ML IJ SOLN
INTRAMUSCULAR | Status: AC
  Filled 2022-04-08: qty 2

## 2022-04-08 MED ORDER — OXYTOCIN-SODIUM CHLORIDE 30-0.9 UT/500ML-% IV SOLN
2.5000 [IU]/h | INTRAVENOUS | Status: AC
  Administered 2022-04-08: 2.5 [IU]/h via INTRAVENOUS

## 2022-04-08 MED ORDER — LACTATED RINGERS IV SOLN: INTRAVENOUS | Status: DC

## 2022-04-08 MED ORDER — DEXAMETHASONE SODIUM PHOSPHATE 10 MG/ML IJ SOLN
INTRAMUSCULAR | Status: AC
  Filled 2022-04-08: qty 1

## 2022-04-08 SURGICAL SUPPLY — 30 items
BACTOSHIELD CHG 4% 4OZ (MISCELLANEOUS) ×1
BAG COUNTER SPONGE SURGICOUNT (BAG) ×2 IMPLANT
CHLORAPREP W/TINT 26 (MISCELLANEOUS) ×4 IMPLANT
DERMABOND ADVANCED (GAUZE/BANDAGES/DRESSINGS) ×1
DERMABOND ADVANCED .7 DNX12 (GAUZE/BANDAGES/DRESSINGS) IMPLANT
DRSG TELFA 3X8 NADH (GAUZE/BANDAGES/DRESSINGS) ×2 IMPLANT
ELECT REM PT RETURN 9FT ADLT (ELECTROSURGICAL) ×2
ELECTRODE REM PT RTRN 9FT ADLT (ELECTROSURGICAL) ×1 IMPLANT
EXTRT SYSTEM ALEXIS 17CM (MISCELLANEOUS)
GAUZE SPONGE 4X4 12PLY STRL (GAUZE/BANDAGES/DRESSINGS) ×2 IMPLANT
GLOVE BIO SURGEON STRL SZ 6.5 (GLOVE) ×2 IMPLANT
GLOVE SURG UNDER LTX SZ7 (GLOVE) ×2 IMPLANT
GOWN STRL REUS W/ TWL LRG LVL3 (GOWN DISPOSABLE) ×2 IMPLANT
GOWN STRL REUS W/TWL LRG LVL3 (GOWN DISPOSABLE) ×2
KIT TURNOVER KIT A (KITS) ×2 IMPLANT
MANIFOLD NEPTUNE II (INSTRUMENTS) ×2 IMPLANT
MAT PREVALON FULL STRYKER (MISCELLANEOUS) ×2 IMPLANT
NS IRRIG 1000ML POUR BTL (IV SOLUTION) ×2 IMPLANT
PACK C SECTION AR (MISCELLANEOUS) ×2 IMPLANT
PAD DRESSING TELFA 3X8 NADH (GAUZE/BANDAGES/DRESSINGS) ×1 IMPLANT
PAD OB MATERNITY 4.3X12.25 (PERSONAL CARE ITEMS) ×2 IMPLANT
PAD PREP 24X41 OB/GYN DISP (PERSONAL CARE ITEMS) ×2 IMPLANT
SCRUB CHG 4% DYNA-HEX 4OZ (MISCELLANEOUS) ×1 IMPLANT
SUT MNCRL AB 4-0 PS2 18 (SUTURE) ×2 IMPLANT
SUT PLAIN 2 0 XLH (SUTURE) IMPLANT
SUT VIC AB 0 CT1 36 (SUTURE) ×8 IMPLANT
SUT VIC AB 3-0 SH 27 (SUTURE) ×1
SUT VIC AB 3-0 SH 27X BRD (SUTURE) ×1 IMPLANT
SYSTEM CONTND EXTRCTN KII BLLN (MISCELLANEOUS) IMPLANT
WATER STERILE IRR 500ML POUR (IV SOLUTION) ×2 IMPLANT

## 2022-04-08 NOTE — Anesthesia Procedure Notes (Signed)
Procedure Name: Intubation Date/Time: 04/08/2022 5:06 AM Performed by: Katherine Basset, CRNA Pre-anesthesia Checklist: Patient identified, Emergency Drugs available, Suction available, Patient being monitored and Timeout performed Patient Re-evaluated:Patient Re-evaluated prior to induction Oxygen Delivery Method: Circle system utilized Preoxygenation: Pre-oxygenation with 100% oxygen Induction Type: IV induction, Rapid sequence and Cricoid Pressure applied Laryngoscope Size: McGraph and 3 Grade View: Grade I Tube type: Oral Tube size: 7.0 mm Number of attempts: 1 Airway Equipment and Method: Stylet and Oral airway Placement Confirmation: ETT inserted through vocal cords under direct vision, positive ETCO2 and breath sounds checked- equal and bilateral Secured at: 20 cm Tube secured with: Tape Dental Injury: Teeth and Oropharynx as per pre-operative assessment

## 2022-04-08 NOTE — Progress Notes (Signed)
Holly Wu is a 26 y.o. G1P0000 at [redacted]w[redacted]d by ultrasound admitted for rupture of membranes. She has continued on IV pitocin. Comfortable with her epidural.  Subjective:She is feeling shaky again. Has had a fever which responded well to po Tylenol. Receiving IV Ampicillin and Gentamycin for new diagnosis of chorioamnionitis. Her husband and mother are present and supportive. She is aware that her labor has not progressed despite hours of contractions and IV pitocin.   Objective: BP (!) 102/51   Pulse 77   Temp 99.7 F (37.6 C) (Oral)   Resp 16   Ht 5\' 2"  (1.575 m)   Wt 97.5 kg   LMP  (LMP Unknown)   SpO2 95%   BMI 39.32 kg/m  No intake/output data recorded. Total I/O In: -  Out: 900 [Urine:900]  FHT:  FHR: 150 bpm, variability: moderate,  accelerations:  Present,  decelerations:  Absent UC:   regular, every 2-3  minutes SVE:   Dilation: 5 Effacement (%): 90 Station: -2 Exam by:: Greeson RN OP suspected, + molding and caput noted. Pitocin was increased to 20 mu/min, then dropped to 10 and slowly increased again.  Labs: Lab Results  Component Value Date   WBC 19.2 (H) 04/07/2022   HGB 10.6 (L) 04/07/2022   HCT 33.9 (L) 04/07/2022   MCV 77.9 (L) 04/07/2022   PLT 260 04/07/2022    Assessment / Plan: Maternal fever and Dx of Chorioamnionitis  Labor:  Will stop the pitocin, IUPC out  Fetal Wellbeing:  Category I Pain Control:  Epidural I/D:  n/a Anticipated MOD:   Discussed the relative lack of any progress over many hours with adequate contractions. Recommendation for Cesarean section made after discussion with Dr. 04/09/2022 and the patient. Will proceed with prep for primary CS. Risks and benefits of CS delivery reviewed. IUPC removed. OR has been notified.  Holly Wu 04/08/2022, 3:02 AM

## 2022-04-08 NOTE — Transfer of Care (Signed)
Immediate Anesthesia Transfer of Care Note  Patient: Holly Wu  Procedure(s) Performed: CESAREAN SECTION  Patient Location: Mother/Baby  Anesthesia Type:General and Epidural  Level of Consciousness: awake, alert  and oriented  Airway & Oxygen Therapy: Patient Spontanous Breathing and Patient connected to nasal cannula oxygen  Post-op Assessment: Report given to RN, Post -op Vital signs reviewed and stable and Patient moving all extremities  Post vital signs: Reviewed and stable  Last Vitals:  Vitals Value Taken Time  BP 128/94 04/08/22 0609  Temp    Pulse    Resp    SpO2    Vitals shown include unvalidated device data.  Last Pain:  Vitals:   04/08/22 0200  TempSrc: Oral  PainSc:       Patients Stated Pain Goal: 0 (04/07/22 1119)  Complications: No notable events documented.

## 2022-04-08 NOTE — Op Note (Signed)
Cesarean Section Procedure Note  Indications: chorioamnionitis and failure to progress: arrest of dilation, suspected fetal macrosomia  Pre-operative Diagnosis: 41 week 0 day pregnancy, arrest of dilation (5 cm), chorioamnionitis, suspected fetal macrosomia.  Post-operative Diagnosis: Same with confirmed macrosomia  Surgeon: Hildred Laser, MD  Assistants:  Paula Compton, CNM  Procedure: Primary low transverse Cesarean Section  Anesthesia: Epidural anesthesia, converted to general anesthesia due to inadequate level  Findings: Female infant, cephalic presentation, 4170 grams, with Apgar scores of 2 at one minute and 9 at five minutes. Intact placenta with 3 vessel cord.  Clear amniotic fluid at amniotomy, mild odor appreciated The uterine outline, tubes and ovaries appeared normal.   Procedure Details: The patient was seen in the Holding Room. The risks, benefits, complications, treatment options, and expected outcomes were discussed with the patient.  The patient concurred with the proposed plan, giving informed consent.  The site of surgery properly noted/marked. The patient was taken to the Operating Room, identified as Holly Wu and the procedure verified as C-Section Delivery. A Time Out was held and the above information confirmed.  After dosing of the patient's epidural to levels adequate for surgery,  the patient was draped and prepped in the usual sterile manner. Anesthesia was tested and noted to be inadequate. The patient was then transitioned to General Anesthesia for comfort for procedure.  A Pfannenstiel incision was made and carried down through the subcutaneous tissue to the fascia. Fascial incision was made and extended transversely. The fascia was separated from the underlying rectus tissue superiorly and inferiorly. The peritoneum was identified and entered. Peritoneal incision was extended longitudinally. The surgical assist was able to provide retraction to  allow for clear visualization of surgical site. A bladder blade was placed. The utero-vesical peritoneal reflection was incised transversely and the bladder flap was bluntly freed from the lower uterine segment. A low transverse uterine incision was made. Mild odor noted at amniotomy. Delivered from cephalic presentation was a 4170 gram Female with Apgar scores of 2 at one minute and 9 at five minutes.  The assistant was able to apply adequate fundal pressure to allow for successful delivery of the fetus. After the umbilical cord was clamped and cut, cord blood was obtained for evaluation. Delayed cord clamping was observed. The placenta was removed intact and appeared normal. The uterus was exteriorized and cleared of all clots and debris. The uterine outline, tubes and ovaries appeared normal.  The uterine incision was closed with running locked sutures of 0-Vicryl.  A second suture of 0-Vicryl was used in an imbricating layer.  Hemostasis was observed. The uterus was noted to continue to be boggy throughout the incision repair, so the decision was made to administer 0.25 mg of Methergine intramuscularly directly into the uterine muscle. Once firming of the uterus was noted, the uterus was then returned to the abdomen. The pericolic gutters were cleared of all clots and debris. The periotoneum was then grasped with Kocher clamps and reapproximated in a running fashion using 3-0 Vicryl.  Seven ml of Zynrelef (topical bupivicaine and meloxicam) was applied to the peritoneum. The fascia was then reapproximated with a running suture of 0-Vicryl. An additional 7 ml of Zynrelef was applied to the fascia layer. The subcutaneous fat layer was reapproximated with 2-0 Vicryl. The skin was reapproximated with 4-0 Monocryl.  The incision was covered with Dermabond.   Instrument, sponge, and needle counts were correct prior the abdominal closure and at the conclusion of the case.  An experienced assistant was required  given the standard of surgical care given the complexity of the case.  This assistant was needed for exposure, dissection, suctioning, retraction, instrument exchange, and for overall help during the procedure.  Estimated Blood Loss:  600 ml      Drains: foley catheter to gravity drainage, 150 ml of clear urine at end of the procedure         Total IV Fluids:  500 ml  Specimens: Placenta, sent to pathology         Implants: None         Complications:  None; patient tolerated the procedure well.         Disposition: PACU - hemodynamically stable.         Condition: stable   Hildred Laser, MD [facility]

## 2022-04-08 NOTE — Lactation Note (Signed)
This note was copied from a baby's chart. Lactation Consultation Note  Patient Name: Holly Wu HDTPN'S Date: 04/08/2022 Reason for consult: Initial assessment;Primapara;Term;Other (Comment) (C/S) Age:24 hours  Maternal Data  This is mom's first baby. Per chart review mom with history of PCOS, right bundle branch block, scoliosis, late prenatal care. Mom with prolonged rupture of AROM, fever,  chorioamnionitis and delivered by C/S. Mom's initial feeding plan was breastfeeding and formula feeding. Mom has not breastfed and baby is exclusively formula feeding thus far. Met with mom to offered breastfeeding assistance and/or initiation of breastpump and clarify mom's feeding choice.  Has patient been taught Hand Expression?: No Does the patient have breastfeeding experience prior to this delivery?: No  Feeding Mother's Current Feeding Choice: Formula Nipple Type: Slow - flow  Interventions Provided mom with information about how the body knows to make milk and discussed options if she would like to establish a milk supply. Per mom at this time her choice is to hold off on breastfeeding or initiating pumping. Baby has just bottle fed formula. Mom understands if she changes her mind the care nurse can assist her and/or LC is available as needed.   Consult Status Consult Status: PRN  Update provided to care nurse.  Holly Wu 04/08/2022, 3:37 PM

## 2022-04-09 ENCOUNTER — Encounter: Payer: Self-pay | Admitting: Obstetrics and Gynecology

## 2022-04-09 LAB — CBC
HCT: 25 % — ABNORMAL LOW (ref 36.0–46.0)
Hemoglobin: 7.7 g/dL — ABNORMAL LOW (ref 12.0–15.0)
MCH: 24.7 pg — ABNORMAL LOW (ref 26.0–34.0)
MCHC: 30.8 g/dL (ref 30.0–36.0)
MCV: 80.1 fL (ref 80.0–100.0)
Platelets: 234 10*3/uL (ref 150–400)
RBC: 3.12 MIL/uL — ABNORMAL LOW (ref 3.87–5.11)
RDW: 15.2 % (ref 11.5–15.5)
WBC: 28.8 10*3/uL — ABNORMAL HIGH (ref 4.0–10.5)
nRBC: 0 % (ref 0.0–0.2)

## 2022-04-09 LAB — SURGICAL PATHOLOGY

## 2022-04-09 MED ORDER — EPINEPHRINE PF 1 MG/ML IJ SOLN
0.3000 mg | Freq: Once | INTRAMUSCULAR | Status: DC | PRN
  Filled 2022-04-09: qty 1

## 2022-04-09 MED ORDER — METHYLPREDNISOLONE SODIUM SUCC 125 MG IJ SOLR
125.0000 mg | Freq: Once | INTRAMUSCULAR | Status: DC | PRN
  Filled 2022-04-09: qty 2

## 2022-04-09 MED ORDER — SODIUM CHLORIDE 0.9 % IV SOLN
500.0000 mg | Freq: Once | INTRAVENOUS | Status: AC
  Administered 2022-04-09: 500 mg via INTRAVENOUS
  Filled 2022-04-09: qty 25

## 2022-04-09 MED ORDER — ALBUTEROL SULFATE (2.5 MG/3ML) 0.083% IN NEBU: 2.5000 mg | INHALATION_SOLUTION | Freq: Once | RESPIRATORY_TRACT | Status: DC | PRN

## 2022-04-09 MED ORDER — SODIUM CHLORIDE 0.9 % IV SOLN
INTRAVENOUS | Status: DC | PRN
  Administered 2022-04-09: 10 mL via INTRAVENOUS

## 2022-04-09 MED ORDER — SODIUM CHLORIDE 0.9 % IV BOLUS: 500.0000 mL | Freq: Once | INTRAVENOUS | Status: DC | PRN

## 2022-04-09 NOTE — Progress Notes (Signed)
Postpartum Day # 1: Cesarean Delivery  Subjective: Patient reports tolerating PO, + flatus, and no problems voiding.  Reports pain is well controlled. Is breast and formula feeding.   Objective: Vital signs in last 24 hours: Temp:  [97.6 F (36.4 C)-98.5 F (36.9 C)] 98 F (36.7 C) (05/23 0856) Pulse Rate:  [60-71] 60 (05/23 0856) Resp:  [18-20] 20 (05/23 0856) BP: (98-112)/(60-72) 102/63 (05/23 0856) SpO2:  [97 %-98 %] 98 % (05/23 0856)  Physical Exam:  General: alert and no distress Lungs: clear to auscultation bilaterally Breasts: normal appearance, no masses or tenderness Heart: regular rate and rhythm, S1, S2 normal, no murmur, click, rub or gallop Abdomen: soft, non-tender; bowel sounds normal; no masses,  no organomegaly Pelvis: Lochia appropriate, Uterine Fundus firm, Incision: healing well, no significant drainage, no dehiscence, no significant erythema Extremities: DVT Evaluation: No evidence of DVT seen on physical exam. Negative Homan's sign. No cords or calf tenderness. No significant calf/ankle edema.  Recent Labs    04/07/22 1205 04/09/22 0624  HGB 10.6* 7.7*  HCT 33.9* 25.0*    Assessment/Plan: Status post Cesarean section. Doing well postoperatively.  Breastfeeding,  Contraception POPs Regular diet Continue PO pain management Moderate anemia s/p surgical blood loss (iron deficiency). Asymptomatic. Offered patient iron infusion, is ok to receive.  Continue current care.   Plan for discharge tomorrow.   Hildred Laser, MD [facility]

## 2022-04-09 NOTE — Anesthesia Postprocedure Evaluation (Signed)
Anesthesia Post Note  Patient: Holly Wu  Procedure(s) Performed: [place]  Patient location during evaluation: Mother Baby Anesthesia Type: Epidural Level of consciousness: awake and alert Pain management: pain level controlled Vital Signs Assessment: post-procedure vital signs reviewed and stable Respiratory status: spontaneous breathing, nonlabored ventilation and respiratory function stable Cardiovascular status: stable Postop Assessment: no headache, no backache, patient able to bend at knees and adequate PO intake Anesthetic complications: no   No notable events documented.   Last Vitals:  Vitals:   04/08/22 1940 04/08/22 2324  BP: 106/66 98/60  Pulse: 70 60  Resp: 18 18  Temp: 36.5 C 36.4 C  SpO2: 98% 97%    Last Pain:  Vitals:   04/09/22 0740  TempSrc:   PainSc: 0-No pain                 [name]

## 2022-04-09 NOTE — TOC Initial Note (Signed)
Transition of Care [facility]) - Initial/Assessment Note    Patient Details  Name: Holly Wu MRN: [id number] Date of Birth: [birth year]  [facility] CM/SW Contact:    Parkdale Cellar, RN Phone Number: 04/09/2022, 12:30 PM  Clinical Narrative:                 Spoke with patient who reports she is doing great and has all needed equipment. Discussed how to enroll in [facility] services and infant is set up with [facility]. Patient will be staying home with infant. No history of anxiety/depression per mother however discussed PPD and resources if needed. Patient reports she was late starting prenatal care due to not knowing she was pregnant and it being such a surprise. Strong support system at all including husband and her mother. No current TOC needs or concerns. Husband will be transportation home once medically cleared.         Patient Goals and CMS Choice        Expected Discharge Plan and Services                                                Prior Living Arrangements/Services                       Activities of Daily Living Home Assistive Devices/Equipment: None ADL Screening (condition at time of admission) Patient's cognitive ability adequate to safely complete daily activities?: Yes Is the patient deaf or have difficulty hearing?: No Does the patient have difficulty seeing, even when wearing glasses/contacts?: No Does the patient have difficulty concentrating, remembering, or making decisions?: No Patient able to express need for assistance with ADLs?: Yes Does the patient have difficulty dressing or bathing?: No Independently performs ADLs?: Yes (appropriate for developmental age) Does the patient have difficulty walking or climbing stairs?: No Weakness of Legs: None Weakness of Arms/Hands: None  Permission Sought/Granted                  Emotional Assessment              Admission diagnosis:  Encounter for  induction of labor [Z34.90] Uterine contractions at greater than 20 weeks of gestation [O47.9] Patient Active Problem List   Diagnosis Date Noted   Encounter for induction of labor 04/07/2022   Uterine contractions at greater than 20 weeks of gestation 04/07/2022   Labor and delivery, indication for care 03/21/2022   Supervision of normal pregnancy 10/31/2021   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 [place] [address] Phone: [phone] Fax: [phone]  [id number] [place], [place] - [address] [place] [address] Phone: [phone] Fax: [phone]     Social Determinants of Health (SDOH) Interventions    Readmission Risk Interventions     View : No data to display.

## 2022-04-10 MED ORDER — ONDANSETRON HCL 4 MG PO TABS
4.0000 mg | ORAL_TABLET | Freq: Four times a day (QID) | ORAL | Status: DC | PRN
  Administered 2022-04-10: 4 mg via ORAL
  Filled 2022-04-10: qty 1

## 2022-04-10 NOTE — Progress Notes (Signed)
Obstetric Postpartum/PostOperative Daily Progress Note Subjective:  26 y.o. G1P1001 post-operative day # 2 status post primary cesarean delivery.  She is ambulating, is tolerating po, is voiding spontaneously.  Her pain is well controlled on PO pain medications. Her lochia is less than menses. She has felt feverish this morning. Fever has resolved by 11 AM following administration of tylenol/ibuprofen.   Medications SCHEDULED MEDICATIONS   acetaminophen  1,000 mg Oral Q6H   ferrous sulfate  325 mg Oral QODAY   gabapentin  300 mg Oral BID   ibuprofen  600 mg Oral Q6H   measles, mumps & rubella vaccine  0.5 mL Subcutaneous Once   prenatal multivitamin  1 tablet Oral Q1200   senna-docusate  2 tablet Oral Daily    MEDICATION INFUSIONS   sodium chloride 10 mL (04/09/22 0929)   lactated ringers 125 mL/hr at 04/08/22 1130   naLOXone (NARCAN) adult infusion for PRURITIS     sodium chloride      PRN MEDICATIONS  sodium chloride, albuterol, coconut oil, witch hazel-glycerin **AND** dibucaine, diphenhydrAMINE **OR** diphenhydrAMINE, diphenhydrAMINE, EPINEPHrine, HYDROmorphone (DILAUDID) injection, magnesium hydroxide, menthol-cetylpyridinium, methylPREDNISolone (SOLU-MEDROL) injection, naloxone **AND** sodium chloride flush, naLOXone (NARCAN) adult infusion for PRURITIS, ondansetron, oxyCODONE, oxyCODONE, oxyCODONE-acetaminophen, simethicone, sodium chloride, varicella virus vaccine live, zolpidem    Objective:   Vitals:   04/10/22 0729 04/10/22 0754 04/10/22 0859 04/10/22 1108  BP:  120/72    Pulse:  96    Resp:  20    Temp: 99.3 F (37.4 C) 99.6 F (37.6 C) 99.3 F (37.4 C) 98.2 F (36.8 C)  TempSrc: Axillary Oral Oral Oral  SpO2:  93%    Weight:      Height:        Current Vital Signs 24h Vital Sign Ranges  T 98.2 F (36.8 C) Temp  Avg: 98.7 F (37.1 C)  Min: 97.8 F (36.6 C)  Max: 99.6 F (37.6 C)  BP 120/72 BP  Min: 115/67  Max: 120/72  HR 96 Pulse  Avg: 93.8  Min: 85  Max:  99  RR 20 Resp  Avg: 19  Min: 16  Max: 22  SaO2 93 % Room Air SpO2  Avg: 96.5 %  Min: 93 %  Max: 99 %       24 Hour I/O Current Shift I/O  Time Ins Outs No intake/output data recorded. No intake/output data recorded.  General: NAD Pulmonary: no increased work of breathing Abdomen: non-distended, non-tender, fundus firm at level of umbilicus Inc: no sign of infection, no drainage, no redness Extremities: no edema, no erythema, no tenderness  Labs:  Recent Labs  Lab 04/07/22 1205 04/09/22 0624  WBC 19.2* 28.8*  HGB 10.6* 7.7*  HCT 33.9* 25.0*  PLT 260 234     Assessment:   26 y.o. G1P1001 postoperative day # 2 status post primary cesarean section  Plan:  1) Acute blood loss anemia - hemodynamically stable and asymptomatic - po ferrous sulfate  2) A POS / Rubella <0.90 (02/07 1502)/ Varicella Not immune  3) TDAP status: offer prior to discharge  4) Formula feeding  5) Contraception = oral contraceptives (estrogen/progesterone)  6) Disposition: continue current care, continue to assess for s/s infection   Tresea Mall, CNM 04/10/2022 2:06 PM

## 2022-04-11 LAB — CBC WITH DIFFERENTIAL/PLATELET
Abs Immature Granulocytes: 0.89 10*3/uL — ABNORMAL HIGH (ref 0.00–0.07)
Basophils Absolute: 0.1 10*3/uL (ref 0.0–0.1)
Basophils Relative: 1 %
Eosinophils Absolute: 0.3 10*3/uL (ref 0.0–0.5)
Eosinophils Relative: 2 %
HCT: 27.4 % — ABNORMAL LOW (ref 36.0–46.0)
Hemoglobin: 8.4 g/dL — ABNORMAL LOW (ref 12.0–15.0)
Immature Granulocytes: 6 %
Lymphocytes Relative: 17 %
Lymphs Abs: 2.7 10*3/uL (ref 0.7–4.0)
MCH: 24.7 pg — ABNORMAL LOW (ref 26.0–34.0)
MCHC: 30.7 g/dL (ref 30.0–36.0)
MCV: 80.6 fL (ref 80.0–100.0)
Monocytes Absolute: 1.3 10*3/uL — ABNORMAL HIGH (ref 0.1–1.0)
Monocytes Relative: 8 %
Neutro Abs: 10.9 10*3/uL — ABNORMAL HIGH (ref 1.7–7.7)
Neutrophils Relative %: 66 %
Platelets: 409 10*3/uL — ABNORMAL HIGH (ref 150–400)
RBC: 3.4 MIL/uL — ABNORMAL LOW (ref 3.87–5.11)
RDW: 16.2 % — ABNORMAL HIGH (ref 11.5–15.5)
Smear Review: NORMAL
WBC: 16.1 10*3/uL — ABNORMAL HIGH (ref 4.0–10.5)
nRBC: 0.3 % — ABNORMAL HIGH (ref 0.0–0.2)

## 2022-04-11 MED ORDER — MEDROXYPROGESTERONE ACETATE 150 MG/ML IM SUSP: 150.0000 mg | Freq: Once | INTRAMUSCULAR | 3 refills | Status: DC

## 2022-04-11 MED ORDER — FERROUS SULFATE 325 (65 FE) MG PO TABS: 325.0000 mg | ORAL_TABLET | ORAL | 3 refills | Status: DC

## 2022-04-11 MED ORDER — ALBUTEROL SULFATE (2.5 MG/3ML) 0.083% IN NEBU: 2.5000 mg | INHALATION_SOLUTION | Freq: Once | RESPIRATORY_TRACT | 12 refills | Status: DC | PRN

## 2022-04-11 MED ORDER — OXYCODONE HCL 5 MG PO TABS: 5.0000 mg | ORAL_TABLET | Freq: Four times a day (QID) | ORAL | 0 refills | Status: AC | PRN

## 2022-04-11 MED ORDER — MEDROXYPROGESTERONE ACETATE 150 MG/ML IM SUSP
150.0000 mg | Freq: Once | INTRAMUSCULAR | Status: AC
  Administered 2022-04-11: 150 mg via INTRAMUSCULAR
  Filled 2022-04-11: qty 1

## 2022-04-11 MED ORDER — ACETAMINOPHEN 500 MG PO TABS
1000.0000 mg | ORAL_TABLET | Freq: Four times a day (QID) | ORAL | 0 refills | Status: AC
Start: 2022-04-11 — End: ?

## 2022-04-11 MED ORDER — IBUPROFEN 600 MG PO TABS: 600.0000 mg | ORAL_TABLET | Freq: Four times a day (QID) | ORAL | 0 refills | Status: DC

## 2022-04-11 MED ORDER — VARICELLA VIRUS VACCINE LIVE 1350 PFU/0.5ML IJ SUSR
0.5000 mL | Freq: Once | INTRAMUSCULAR | Status: DC
  Filled 2022-04-11: qty 0.5

## 2022-04-11 NOTE — Discharge Summary (Addendum)
Obstetrical Discharge Summary  Date of Admission: 04/07/2022 Date of Discharge: 04/11/2022  Primary OB: [facility]  Gestational Age at Delivery: [redacted]w[redacted]d   Antepartum complications: none Reason for Admission: SROM Date of Delivery: 04/08/2022  Delivered By: Dr Hildred Laser  Delivery Type: repeat cesarean section, low transverse incision Intrapartum complications/course: PROM and chorioamnionitis, arrested of dilation  Anesthesia: epidural and general Placenta: Delivered and expressed via active management. Intact: yes. To pathology: yes.  Laceration: none Episiotomy: LCT uterus EBL: Baby: Liveborn female, APGARs 2/9, weight 4170 g.    Discharge Diagnosis: Delivered. Postop day 3   Postpartum course: Doing well.  Had low grade temperature and hemoglobin dropped to 7.7.  received Iron transfusion.  Has been without a fever or feeling symptoms for over 24 hours. Ambulating and voiding without difficulty. Passing flatus, but no BM yet-using colace. Pain well managed with PRN medications, using Oxy occasionally.  Currently formula feeding, no longer intends to breast feed.  Desires Depo prior to discharge.  Mood is good.  She has good support once home.  Desires to be discharged.  Discharge Vital Signs:  Current Vital Signs 24h Vital Sign Ranges  T 98.1 F (36.7 C) Temp  Avg: 98.6 F (37 C)  Min: 98.1 F (36.7 C)  Max: 99 F (37.2 C)  BP 102/77 BP  Min: 102/77  Max: 120/65  HR 93 Pulse  Avg: 83  Min: 78  Max: 93  RR 18 Resp  Avg: 18  Min: 18  Max: 18  SaO2 96 % Room Air SpO2  Avg: 95.5 %  Min: 95 %  Max: 96 %       24 Hour I/O Current Shift I/O  Time Ins Outs No intake/output data recorded. No intake/output data recorded.    NAD Abdomen: firm fundus below the umbilicus, NTTP, non distended, +bowel sounds.  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 Follow-up appointment with Chestine Spore CNM  in 1 week for a post op  visit Needs colpo/leep at 4 weeks  No future appointments.  Discharge Medications: Allergies as of 04/11/2022   No Known Allergies      Medication List     STOP taking these medications    multivitamin-prenatal 27-0.8 MG Tabs tablet       TAKE these medications    acetaminophen 500 MG tablet Commonly known as: TYLENOL Take 2 tablets (1,000 mg total) by mouth every 6 (six) hours.   albuterol (2.5 MG/3ML) 0.083% nebulizer solution Commonly known as: PROVENTIL Take 3 mLs (2.5 mg total) by nebulization once as needed for wheezing or shortness of breath (for moderate/severe hypersensitivity reactions).   ferrous sulfate 325 (65 FE) MG tablet Take 1 tablet (325 mg total) by mouth every other day. Start taking on: Apr 13, 2022   ibuprofen 600 MG  tablet Commonly known as: ADVIL Take 1 tablet (600 mg total) by mouth every 6 (six) hours.   medroxyPROGESTERone 150 MG/ML injection Commonly known as: DEPO-PROVERA Inject 1 mL (150 mg total) into the muscle once for 1 dose. Start taking on: July 04, 2022   oxyCODONE 5 MG immediate release tablet Commonly known as: Oxy IR/ROXICODONE Take 1 tablet (5 mg total) by mouth every 6 (six) hours as needed for up to 3 days for severe pain.                Discharge Care Instructions  (From admission, onward)           Start     Ordered   04/11/22 0000  Discharge wound care:       Comments: SHOWER DAILY Wash incision gently with soap and water.  Call office with any drainage, redness, or firmness of the incision.   04/11/22 0920          Carie Caddy, CNM  [facility], [facility]  @TODAY @  9:30 AM

## 2022-04-11 NOTE — Progress Notes (Signed)
Pt discharged with infant. Discharge instructions, prescriptions, and follow up appointments given to and reviewed with patient. Pt verbalized understanding. To be escorted out by auxillary.  °

## 2022-04-17 ENCOUNTER — Encounter: Payer: Self-pay | Admitting: Licensed Practical Nurse

## 2022-04-17 ENCOUNTER — Ambulatory Visit (INDEPENDENT_AMBULATORY_CARE_PROVIDER_SITE_OTHER): Payer: Federal, State, Local not specified - PPO | Admitting: Licensed Practical Nurse

## 2022-04-17 DIAGNOSIS — R109 Unspecified abdominal pain: Secondary | ICD-10-CM

## 2022-04-17 DIAGNOSIS — R399 Unspecified symptoms and signs involving the genitourinary system: Secondary | ICD-10-CM

## 2022-04-17 LAB — POCT URINALYSIS DIPSTICK
Bilirubin, UA: NEGATIVE
Blood, UA: POSITIVE
Glucose, UA: NEGATIVE
Ketones, UA: NEGATIVE
Nitrite, UA: NEGATIVE
Protein, UA: NEGATIVE
Spec Grav, UA: 1.01 (ref 1.010–1.025)
Urobilinogen, UA: NEGATIVE E.U./dL — AB
pH, UA: 5 (ref 5.0–8.0)

## 2022-04-17 NOTE — Progress Notes (Signed)
Postpartum Visit  Chief Complaint:  Chief Complaint  Patient presents with   Wound Check    History of Present Illness: Patient is a 26 y.o. G1P1001 presents for postpartum visit.  Date of delivery: 5/22 Type of delivery: LTCS Episiotomy LTCS Laceration: no  Pregnancy or labor problems:  yes, chorio, failure to progress Any problems since the delivery:  no Bleeding like a heavy period Pain managed with Tylenol and Motrin Has gone for walks, feels good No concerns for voiding or stooling Appetite is fine, maybe a little low Mood is good Has had pain on her right flank/rib area since yesterday, it hurts when they drive over a bump, has had a uti in the past that felt like this, denies other urinary symptoms.   Newborn Details:  SINGLETON :  1. Baby's name: girl . Birth weight: 4170 Maternal Details:  Breast Feeding:  was using formula in the hospital d/t need for general anesthesia, Has started pumping today as she desires to give breastmilk, is undecided if she wants to put the infant to the breast.  Has not stimulated the breasts since birth,  Post partum depression/anxiety noted:  no Edinburgh Post-Partum Depression Score:  2  Date of last PAP: 10/31/2021  abnormal HSIL  Past Medical History:  Diagnosis Date   EKG abnormalities    PCOS (polycystic ovarian syndrome)    Right bundle branch block 2017   Scoliosis     Past Surgical History:  Procedure Laterality Date   CESAREAN SECTION  04/08/2022   Procedure: CESAREAN SECTION;  Surgeon: Hildred Laser, MD;  Location: [facility] ORS;  Service: Obstetrics;;   ESOPHAGUS SURGERY     foreign object removed    Prior to Admission medications   Medication Sig Start Date End Date Taking? Authorizing Provider  acetaminophen (TYLENOL) 500 MG tablet Take 2 tablets (1,000 mg total) by mouth every 6 (six) hours. 04/11/22  Yes Akanksha Bellmore, Courtney Heys, CNM  albuterol (PROVENTIL) (2.5 MG/3ML) 0.083% nebulizer solution Take 3 mLs (2.5 mg total)  by nebulization once as needed for wheezing or shortness of breath (for moderate/severe hypersensitivity reactions). 04/11/22  Yes Demisha Nokes, Courtney Heys, CNM  ferrous sulfate 325 (65 FE) MG tablet Take 1 tablet (325 mg total) by mouth every other day. 04/13/22  Yes Nikko Goldwire, Courtney Heys, CNM  ibuprofen (ADVIL) 600 MG tablet Take 1 tablet (600 mg total) by mouth every 6 (six) hours. 04/11/22  Yes Lenzie Sandler, Courtney Heys, CNM  medroxyPROGESTERone (DEPO-PROVERA) 150 MG/ML injection Inject 1 mL (150 mg total) into the muscle once for 1 dose. 07/04/22 07/04/22 Yes Drenda Sobecki, Courtney Heys, CNM    No Known Allergies   Social History   Socioeconomic History   Marital status: Single    Spouse name: Not on file   Number of children: Not on file   Years of education: Not on file   Highest education level: Not on file  Occupational History   Not on file  Tobacco Use   Smoking status: Never   Smokeless tobacco: Never  Vaping Use   Vaping Use: Some days  Substance and Sexual Activity   Alcohol use: No   Drug use: No   Sexual activity: Yes    Birth control/protection: Pill  Other Topics Concern   Not on file  Social History Narrative   Not on file   Social Determinants of Health   Financial Resource Strain: Not on file  Food Insecurity: Not on file  Transportation Needs: Not on file  Physical Activity:  Not on file  Stress: Not on file  Social Connections: Not on file  Intimate Partner Violence: Not on file    Family History  Problem Relation Age of Onset   Diabetes Mother    Healthy Father    Colon cancer Maternal Grandfather    Breast cancer Paternal Grandmother    Colon cancer Maternal Aunt    Breast cancer Other 60    Review of Systems  Constitutional: Negative.   Respiratory: Negative.    Cardiovascular: Negative.   Gastrointestinal: Negative.   Genitourinary:  Positive for flank pain.  Psychiatric/Behavioral: Negative.      Physical Exam BP 122/68   Ht 5\' 2"  (1.575 m)   Wt  196 lb (88.9 kg)   Breastfeeding Yes   BMI 35.85 kg/m   Physical Exam Constitutional:      Appearance: Normal appearance.  Pulmonary:     Effort: Pulmonary effort is normal.  Chest:     Comments: Breasts: filling, no masses or redness, nipples erect and intact bilaterally  Abdominal:     General: There is no distension.     Tenderness: There is no abdominal tenderness. There is right CVA tenderness. There is no left CVA tenderness.     Comments: Incision intact no redness swelling or drainage   Uterus palpated halfway between umbilicus and SP.   Musculoskeletal:        General: Normal range of motion.  Neurological:     General: No focal deficit present.     Mental Status: She is alert and oriented to person, place, and time.  Skin:    General: Skin is warm.  Psychiatric:        Mood and Affect: Mood normal.     Female Chaperone present during breast and/or pelvic exam.  Assessment: 26 y.o. G1P1001 presenting for 1 week postpartum visit  Plan: Problem List Items Addressed This Visit   None    1) Contraception was given depo on Discharge   2)  Pap needs Colpo or LEEP at 4 to 6 wks PP, will schedule with Dr 30.    3) Patient underwent screening for postpartum depression with NO concerns noted.  4) Flank pain, urine culture sent. Offered antibiotics now verses waiting for culture, Lilliahna prefers to wait for culture results.  Lisabeth Pick, CNM  Carie Caddy, [facility]  04/17/22  11:52 AM

## 2022-04-19 LAB — URINE CULTURE

## 2022-04-27 ENCOUNTER — Encounter: Payer: Self-pay | Admitting: Licensed Practical Nurse

## 2022-05-14 ENCOUNTER — Ambulatory Visit: Payer: Federal, State, Local not specified - PPO | Admitting: Licensed Practical Nurse

## 2022-05-14 ENCOUNTER — Telehealth: Payer: Self-pay | Admitting: Licensed Practical Nurse

## 2022-05-14 NOTE — Telephone Encounter (Signed)
Contacted pt to reschedule 6 week pp checkup with Depo from 6/27.  Left message for pt to call back to reschedule.

## 2022-05-15 ENCOUNTER — Encounter: Payer: Self-pay | Admitting: Licensed Practical Nurse

## 2022-05-15 NOTE — Telephone Encounter (Signed)
Contacted pt to reschedule 6 week pp check up with Depo from 6/27 (x 2).  Tried to leave a message, but phone rang and then went to a busy signal.  Will send a MyChart letter to patient.

## 2022-05-28 ENCOUNTER — Emergency Department: Payer: Federal, State, Local not specified - PPO

## 2022-05-28 ENCOUNTER — Emergency Department
Admission: EM | Admit: 2022-05-28 | Discharge: 2022-05-28 | Disposition: A | Discharge status: Home / Self Care | Payer: Federal, State, Local not specified - PPO | Attending: Emergency Medicine | Admitting: Emergency Medicine

## 2022-05-28 ENCOUNTER — Ambulatory Visit
Admission: EM | Admit: 2022-05-28 | Discharge: 2022-05-28 | Disposition: A | Discharge status: Home / Self Care | Payer: Federal, State, Local not specified - PPO | Attending: Internal Medicine | Admitting: Internal Medicine

## 2022-05-28 ENCOUNTER — Other Ambulatory Visit: Payer: Self-pay

## 2022-05-28 DIAGNOSIS — K8020 Calculus of gallbladder without cholecystitis without obstruction: Secondary | ICD-10-CM | POA: Diagnosis not present

## 2022-05-28 DIAGNOSIS — R1013 Epigastric pain: Secondary | ICD-10-CM | POA: Diagnosis not present

## 2022-05-28 DIAGNOSIS — R1011 Right upper quadrant pain: Secondary | ICD-10-CM

## 2022-05-28 LAB — CBC
HCT: 40.5 % (ref 36.0–46.0)
Hemoglobin: 12.3 g/dL (ref 12.0–15.0)
MCH: 25.1 pg — ABNORMAL LOW (ref 26.0–34.0)
MCHC: 30.4 g/dL (ref 30.0–36.0)
MCV: 82.7 fL (ref 80.0–100.0)
Platelets: 275 10*3/uL (ref 150–400)
RBC: 4.9 MIL/uL (ref 3.87–5.11)
RDW: 17.9 % — ABNORMAL HIGH (ref 11.5–15.5)
WBC: 10.2 10*3/uL (ref 4.0–10.5)
nRBC: 0 % (ref 0.0–0.2)

## 2022-05-28 LAB — LIPASE, BLOOD: Lipase: 33 U/L (ref 11–51)

## 2022-05-28 LAB — COMPREHENSIVE METABOLIC PANEL
ALT: 58 U/L — ABNORMAL HIGH (ref 0–44)
AST: 105 U/L — ABNORMAL HIGH (ref 15–41)
Albumin: 4.3 g/dL (ref 3.5–5.0)
Alkaline Phosphatase: 99 U/L (ref 38–126)
Anion gap: 8 (ref 5–15)
BUN: 12 mg/dL (ref 6–20)
CO2: 21 mmol/L — ABNORMAL LOW (ref 22–32)
Calcium: 9.1 mg/dL (ref 8.9–10.3)
Chloride: 109 mmol/L (ref 98–111)
Creatinine, Ser: 0.82 mg/dL (ref 0.44–1.00)
GFR, Estimated: >60 mL/min (ref >60)
Glucose, Bld: 107 mg/dL — ABNORMAL HIGH (ref 70–99)
Potassium: 4.1 mmol/L (ref 3.5–5.1)
Sodium: 138 mmol/L (ref 135–145)
Total Bilirubin: 0.8 mg/dL (ref 0.3–1.2)
Total Protein: 7.6 g/dL (ref 6.5–8.1)

## 2022-05-28 MED ORDER — ALUM & MAG HYDROXIDE-SIMETH 200-200-20 MG/5ML PO SUSP
30.0000 mL | Freq: Once | ORAL | Status: AC
  Administered 2022-05-28: 30 mL via ORAL
  Filled 2022-05-28: qty 30

## 2022-05-28 MED ORDER — ONDANSETRON 4 MG PO TBDP
4.0000 mg | ORAL_TABLET | Freq: Once | ORAL | Status: DC | PRN
  Filled 2022-05-28: qty 1

## 2022-05-28 NOTE — Discharge Instructions (Signed)
Your ultrasound reveals gallstones without acute infection of your gallbladder.  Please call the general surgeon tomorrow to arrange close outpatient follow-up for elective removal of your gallbladder.  Please return if you develop fever, worsening pain, or any other concerns.  It was a pleasure caring for you today.

## 2022-05-28 NOTE — ED Provider Notes (Signed)
[facility] Provider Note    Event Date/Time   First MD Initiated Contact with Patient 05/28/22 1436     (approximate)   History   Abdominal Pain   HPI  Holly Wu is a 26 y.o. female who presents today for evaluation of right upper quadrant pain.  Patient reports that this was intermittent for 1 to 2 months, but then became more constant over the past 1 week.  She reports that she has been nauseated.  She denies diarrhea.  She denies fevers or chills.  She went to urgent care who sent her here for further evaluation.     Physical Exam   Triage Vital Signs: ED Triage Vitals  Enc Vitals Group     BP 05/28/22 1414 (!) 117/92     Pulse Rate 05/28/22 1414 72     Resp 05/28/22 1414 18     Temp 05/28/22 1414 97.9 F (36.6 C)     Temp Source 05/28/22 1414 Oral     SpO2 05/28/22 1414 100 %     Weight 05/28/22 1415 179 lb 7.3 oz (81.4 kg)     Height 05/28/22 1415 5\' 2"  (1.575 m)     Head Circumference --      Peak Flow --      Pain Score 05/28/22 1414 8     Pain Loc --      Pain Edu? --      Excl. in GC? --     Most recent vital signs: Vitals:   05/28/22 1414 05/28/22 1708  BP: (!) 117/92 120/68  Pulse: 72 70  Resp: 18 16  Temp: 97.9 F (36.6 C)   SpO2: 100% 99%    Physical Exam Vitals and nursing note reviewed.  Constitutional:      General: Awake and alert. No acute distress.    Appearance: Normal appearance. The patient is overweight.  HENT:     Head: Normocephalic and atraumatic.     Mouth: Mucous membranes are moist.  Eyes:     General: PERRL. Normal EOMs        Right eye: No discharge.        Left eye: No discharge.     Conjunctiva/sclera: Conjunctivae normal.  Cardiovascular:     Rate and Rhythm: Normal rate and regular rhythm.     Pulses: Normal pulses.     Heart sounds: Normal heart sounds Pulmonary:     Effort: Pulmonary effort is normal. No respiratory distress.     Breath sounds: Normal breath sounds.   Abdominal:     Abdomen is soft. There is right upper quadrant abdominal tenderness. No rebound or guarding. No distention. Musculoskeletal:        General: No swelling. Normal range of motion.     Cervical back: Normal range of motion and neck supple.  Skin:    General: Skin is warm and dry.     Capillary Refill: Capillary refill takes less than 2 seconds.     Findings: No rash.  Neurological:     Mental Status: The patient is awake and alert.      ED Results / Procedures / Treatments   Labs (all labs ordered are listed, but only abnormal results are displayed) Labs Reviewed  COMPREHENSIVE METABOLIC PANEL - Abnormal; Notable for the following components:      Result Value   CO2 21 (*)    Glucose, Bld 107 (*)    AST 105 (*)  ALT 58 (*)    All other components within normal limits  CBC - Abnormal; Notable for the following components:   MCH 25.1 (*)    RDW 17.9 (*)    All other components within normal limits  LIPASE, BLOOD  URINALYSIS, ROUTINE W REFLEX MICROSCOPIC  POC URINE PREG, ED     EKG     RADIOLOGY I independently reviewed and interpreted imaging and agree with radiologists findings.     PROCEDURES:  Critical Care performed:   Procedures   MEDICATIONS ORDERED IN ED: Medications  ondansetron (ZOFRAN-ODT) disintegrating tablet 4 mg (has no administration in time range)  alum & mag hydroxide-simeth (MAALOX/MYLANTA) 200-200-20 MG/5ML suspension 30 mL (30 mLs Oral Given 05/28/22 1531)     IMPRESSION / MDM / ASSESSMENT AND PLAN / ED COURSE  I reviewed the triage vital signs and the nursing notes.   Differential diagnosis includes, but is not limited to, biliary disease, cholecystitis, choledocholithiasis, pancreatitis, appendicitis, diverticulitis.  Patient presents emergency department awake and alert, hemodynamically stable and afebrile.  She is in no acute distress.  She does have pain in her right upper quadrant.  Labs obtained at triage are  overall reassuring without leukocytosis, mild elevation in AST ALT, normal bilirubin and lipase.  No leukocytosis.  Right upper quadrant ultrasound obtained and demonstrates cholelithiasis without evidence of acute cholecystitis.  Case was discussed with Dr. Everlene Farrier with surgery who recommends outpatient follow up for discussion of cholecystectomy rather than admission for removal.  Currently patient is afebrile and nontoxic in appearance.  She is tolerating p.o.  She did endorse significant improvement after the GI cocktail.  We discussed return precautions in the  meantime for signs of cholecystitis, choledocholithiasis, ascending cholangitis, or any other concerns.  Patient understands and agrees with plan.  Discharged in the care of her mother and significant other.  Case was discussed with Dr. Katrinka Blazing who agrees with assessment and plan.   Patient's presentation is most consistent with acute presentation with potential threat to life or bodily function.   Clinical Course as of 05/28/22 1824  Tue May 28, 2022  1643 Patient reports that her pain has resolved significantly after GI cocktail [JP]  1652 Discussed with general surgery, recommended elective chole, not emergent. D/c patient with outpatient follow up with gen surg [JP]    Clinical Course User Index [JP] Aaisha Sliter, Herb Grays, PA-C     FINAL CLINICAL IMPRESSION(S) / ED DIAGNOSES   Final diagnoses:  Calculus of gallbladder without cholecystitis without obstruction     Rx / DC Orders   ED Discharge Orders     None        Note:  This document was prepared using Dragon voice recognition software and may include unintentional dictation errors.   Jackelyn Hoehn, PA-C 05/28/22 1825    Gilles Chiquito, MD 05/28/22 2108

## 2022-05-28 NOTE — ED Triage Notes (Signed)
Pt c/o chest pain along the right side moving to the back x2-3weeks.  Pt states that it lasts for about 20 minutes and then goes away.   Pt recently had a C-Section and has a history of Gallstones.   Pt denies any urinary pain or discomfort.

## 2022-05-28 NOTE — Discharge Instructions (Signed)
As we discussed, your symptoms are concerning for possible gallbladder disease and gallstones.  Please go to the emergency department at [facility] to be evaluated for possible gallbladder infection or gallstones.  Please go now.

## 2022-05-28 NOTE — ED Provider Notes (Signed)
[facility]    CSN: [id number] Arrival date & time: 05/28/22  1205      History   Chief Complaint Chief Complaint  Patient presents with   Chest Pain   Back Pain         HPI Holly Wu is a 26 y.o. female.   HPI  26 year old female here for evaluation of epigastric pain.  Patient reports that she has been experiencing episodic intermittent epigastric pain that radiates through to her back for the past 2 to 3 weeks.  She states that the pain would come on, last about 20 minutes, and resolved.  Over the last week it has become much more frequent and constant.  It has been associated with some nausea and vomiting, sweating, it is made worse by food consumption.  She denies any shortness of breath or fevers.  Patient is recently postpartum and had a cesarean section on 04/07/2022.  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 Authorizing Provider  acetaminophen (TYLENOL) 500 MG tablet Take 2 tablets (1,000 mg total) by mouth every 6 (six) hours. 04/11/22  Yes Dominic, Courtney Heys, CNM  ferrous sulfate 325 (65 FE) MG tablet Take 1 tablet (325 mg total) by mouth every  other day. 04/13/22  Yes Dominic, Courtney Heys, CNM  ibuprofen (ADVIL) 600 MG tablet Take 1 tablet (600 mg total) by mouth every 6 (six) hours. 04/11/22  Yes Dominic, Courtney Heys, CNM  medroxyPROGESTERone (DEPO-PROVERA) 150 MG/ML injection Inject 1 mL (150 mg total) into the muscle once for 1 dose. 07/04/22 07/04/22 Yes Dominic, Courtney Heys, CNM    Family History Family History  Problem Relation Age of Onset   Diabetes Mother    Healthy Father    Colon cancer Maternal Grandfather    Breast cancer Paternal Grandmother    Colon cancer Maternal Aunt    Breast cancer Other 62    Social History Social History   Tobacco Use   Smoking status: Never   Smokeless tobacco: Never  Vaping Use   Vaping Use: Some days  Substance Use Topics   Alcohol use: No   Drug use: No     Allergies   Patient has no known allergies.   Review of Systems Review of Systems  Constitutional:  Positive for diaphoresis. Negative for fever.  Gastrointestinal:  Positive for abdominal pain, nausea and vomiting.  Genitourinary:  Negative for dysuria, frequency and urgency.  Musculoskeletal:  Positive for back pain.  Skin:  Negative for rash.  Hematological: Negative.   Psychiatric/Behavioral: Negative.       Physical Exam Triage Vital Signs ED Triage Vitals  Enc Vitals Group     BP 05/28/22 1217 (!) 141/130     Pulse Rate 05/28/22 1217 91     Resp 05/28/22 1217 18     Temp 05/28/22 1217 98 F (36.7 C)     Temp Source 05/28/22 1217 Oral     SpO2 05/28/22 1217 100 %     Weight 05/28/22 1215 180 lb (81.6 kg)     Height 05/28/22 1215 5\' 2"  (1.575 m)     Head Circumference --      Peak Flow --      Pain Score 05/28/22 1215 8     Pain Loc --      Pain Edu? --      Excl. in GC? --    No data found.  Updated Vital Signs BP 118/79 (BP Location: Left Arm)   Pulse (!) 54   Temp 98 F (36.7 C) (Oral)   Resp 18   Ht 5\' 2"  (1.575 m)   Wt 180 lb (81.6 kg)   SpO2 100%   Breastfeeding No   BMI 32.92  kg/m   Visual Acuity Right Eye Distance:   Left Eye Distance:   Bilateral Distance:    Right Eye Near:   Left Eye Near:    Bilateral Near:     Physical Exam Vitals and nursing note reviewed.  Constitutional:      Appearance: Normal appearance. She is not ill-appearing.  HENT:     Head: Normocephalic and atraumatic.  Cardiovascular:     Rate and Rhythm: Normal rate and regular rhythm.     Pulses: Normal pulses.     Heart sounds: Normal heart sounds. No murmur heard.    No friction rub. No gallop.  Pulmonary:     Effort: Pulmonary effort is normal.     Breath sounds: Normal breath sounds. No wheezing, rhonchi or rales.  Abdominal:     General: Abdomen is flat. Bowel sounds are normal.     Palpations: Abdomen is soft.     Tenderness: There is abdominal tenderness. There is no right CVA tenderness, left CVA tenderness, guarding or rebound.     Comments: Patient has a positive Murphy sign.  Skin:    General: Skin is warm and dry.     Capillary Refill: Capillary refill takes less than 2 seconds.     Findings: No erythema or rash.  Neurological:     General: No focal deficit present.     Mental Status: She is alert and oriented to person, place, and time.  Psychiatric:        Mood and Affect: Mood normal.        Behavior: Behavior normal.        Thought Content: Thought content normal.        Judgment: Judgment normal.      UC Treatments / Results  Labs (all labs ordered are listed, but only abnormal results are displayed) Labs Reviewed - No data to display  EKG Sinus bradycardia with sinus arrhythmia and a ventricular rate of 52 bpm Parable 154 ms QRS duration 92 ms QT/QTc 4 3 6/405 ms No ST or T wave abnormalities noted No significant change when compared to EKG from 03/11/2016.   Radiology  No results found.  Procedures Procedures (including critical care time)  Medications Ordered in UC Medications - No data to display  Initial Impression / Assessment  and Plan / UC Course  I have reviewed the triage vital signs and the nursing notes.  Pertinent labs & imaging results that were available during my care of the patient were reviewed by me and considered in my medical decision making (see chart for details).  Patient is a very pleasant, nontoxic-appearing 70 old female who is approximately 7 weeks status post C-section presenting for evaluation of 2 to 3 weeks of epigastric pain that radiates through to her back.  Initially she reported chest pain but when asked for clarification she states its below her xiphoid process.  This initially started as intermittent and would come on for about 20 minutes each episode and resolve on its own.  Over the last week it has become more frequent and constant.  Food does make the pain worse.  Patient states she has had some nausea and vomiting as well as intermittent sweating but she denies any fever or shortness of breath.  On exam patient has a benign cardiopulmonary exam with S1-S2 heart sounds with regular rate and rhythm and lung sounds are clear auscultation all fields.  No CVA tenderness on exam.  Abdomen is soft, flat, with positive epigastric and right upper quadrant tenderness.  Patient also has a positive Murphy sign.  Lower abdomen is benign.  EKG was collected at triage and shows sinus bradycardia with sinus arrhythmia but no ST or T wave abnormalities.  This EKG is largely unchanged when compared to 03/11/2016.  I do not believe the pain is cardiac in nature and I am more concerned that it might be her gallbladder.  She has a significant family history of gallbladder disease on her maternal side.  I have advised the patient that she should be evaluated in the emergency room room where she can have her gallbladder evaluated and appropriate intervention should she have gallstones or cholecystitis.  Patient is in agreement and she has decided to go to [facility].  Patient left in stable  condition, ambulatory, via POV.   Final Clinical Impressions(s) / UC Diagnoses   Final diagnoses:  Right upper quadrant abdominal pain     Discharge Instructions      As we discussed, your symptoms are concerning for possible gallbladder disease and gallstones.  Please go to the emergency department at [facility] to be evaluated for possible gallbladder infection or gallstones.  Please go now.     ED Prescriptions   None    PDMP not reviewed this encounter.   Becky Augusta, NP 05/28/22 1258

## 2022-05-28 NOTE — ED Triage Notes (Signed)
Pt presents to ED and was sent [facility] for ABD pain and nausea. Pt concerned for gallstones, family HX. Pt states pain goes through to her back and states pain gets worse after she eats fatty foods. NAD noted. VSS.

## 2022-05-29 ENCOUNTER — Telehealth: Payer: Self-pay

## 2022-05-29 NOTE — Patient Outreach (Signed)
Care Coordination  05/29/2022  Holly Wu 1996-05-24 [id number]  Transition Care Management Follow-up Telephone Call Date of discharge and from where: 05/28/22 [facility]  How have you been since you were released from the hospital? Good Any questions or concerns? No  Items Reviewed: Did the pt receive and understand the discharge instructions provided? Yes  Medications obtained and verified? No  Other? No  Any new allergies since your discharge? No  Dietary orders reviewed? No Do you have support at home? Yes   Home Care and Equipment/Supplies: Were home health services ordered? not applicable If so, what is the name of the agency?  Has the agency set up a time to come to the patient's home? not applicable Were any new equipment or medical supplies ordered?  No What is the name of the medical supply agency?  Were you able to get the supplies/equipment? not applicable Do you have any questions related to the use of the equipment or supplies? No  Functional Questionnaire: (I = Independent and D = Dependent) ADLs: I  Bathing/Dressing- I  Meal Prep- I  Eating- I  Maintaining continence- I  Transferring/Ambulation- I  Managing Meds- I  Follow up appointments reviewed:  PCP Hospital f/u appt confirmed? Yes  Scheduled to see Holly Wu on 06/03/22 @ 1:30. Specialist Hospital f/u appt confirmed? No  Scheduled to see  Are transportation arrangements needed? No  If their condition worsens, is the pt aware to call PCP or go to the Emergency Dept.? Yes Was the patient provided with contact information for the PCP's office or ED? Yes Was to pt encouraged to call back with questions or concerns? Yes

## 2022-06-03 ENCOUNTER — Ambulatory Visit (INDEPENDENT_AMBULATORY_CARE_PROVIDER_SITE_OTHER): Payer: Federal, State, Local not specified - PPO | Admitting: Surgery

## 2022-06-03 ENCOUNTER — Other Ambulatory Visit: Payer: Self-pay

## 2022-06-03 ENCOUNTER — Encounter: Payer: Self-pay | Admitting: Surgery

## 2022-06-03 VITALS — BP 130/79 | HR 66 | Temp 98.6°F | Ht 62.0 in | Wt 186.8 lb

## 2022-06-03 DIAGNOSIS — K8020 Calculus of gallbladder without cholecystitis without obstruction: Secondary | ICD-10-CM | POA: Diagnosis not present

## 2022-06-03 NOTE — Patient Instructions (Signed)
Our surgery scheduler will call you within 24-48 hours to schedule your surgery.Please have the Blue surgery sheet available when speaking with her.    Cholelithiasis  Cholelithiasis is a disease in which gallstones form in the gallbladder. The gallbladder is an organ that stores bile. Bile is a fluid that helps to digest fats. Gallstones begin as small crystals and can slowly grow into stones. They may cause no symptoms until they block the gallbladder duct, or cystic duct, when the gallbladder tightens (contracts) after food is eaten. This can cause pain and is known as a gallbladder attack, or biliary colic. There are two main types of gallstones: Cholesterol stones. These are the most common type of gallstone. These stones are made of hardened cholesterol and are usually yellow-green in color. Cholesterol is a fat-like substance that is made in the liver. Pigment stones. These are dark in color and are made of a red-yellow substance, called bilirubin,that forms when hemoglobin from red blood cells breaks down. What are the causes? This condition may be caused by an imbalance in the different parts that make bile. This can happen if the bile: Has too much bilirubin. This can happen in certain blood diseases, such as sickle cell anemia. Has too much cholesterol. Does not have enough bile salts. These salts help the body absorb and digest fats. In some cases, this condition can also be caused by the gallbladder not emptying completely or often enough. This is common during pregnancy. What increases the risk? The following factors may make you more likely to develop this condition: Being female. Having multiple pregnancies. Health care providers sometimes advise removing diseased gallbladders before future pregnancies. Eating a diet that is heavy in fried foods, fat, and refined carbohydrates, such as white bread and white rice. Being obese. Being older than age 40. Using medicines that contain  female hormones (estrogen) for a long time. Losing weight quickly. Having a family history of gallstones. Having certain medical problems, such as: Diabetes mellitus. Cystic fibrosis. Crohn's disease. Cirrhosis or other long-term (chronic) liver disease. Certain blood diseases, such as sickle cell anemia or leukemia. What are the signs or symptoms? In many cases, having gallstones causes no symptoms. When you have gallstones but do not have symptoms, you have silent gallstones. If a gallstone blocks your bile duct, it can cause a gallbladder attack. The main symptom of a gallbladder attack is sudden pain in the upper right part of the abdomen. The pain: Usually comes at night or after eating. Can last for one hour or more. Can spread to your right shoulder, back, or chest. Can feel like indigestion. This is discomfort, burning, or fullness in your upper abdomen. If the bile duct is blocked for more than a few hours, it can cause an infection or inflammation of your gallbladder (cholecystitis), liver, or pancreas. This can cause: Nausea or vomiting. Bloating. Pain in your abdomen that lasts for 5 hours or longer. Tenderness in your upper abdomen, often in the upper right section and under your rib cage. Fever or chills. Skin or the white parts of your eyes turning yellow (jaundice). This usually happens when a stone has blocked bile from passing through the common bile duct. Dark urine or light-colored stools. How is this diagnosed? This condition may be diagnosed based on: A physical exam. Your medical history. Ultrasound. CT scan. MRI. You may also have other tests, including: Blood tests to check for signs of an infection or inflammation. Cholescintigraphy, or HIDA scan. This is a   scan of your gallbladder and bile ducts (biliary system) using non-harmful radioactive material and special cameras that can see the radioactive material. Endoscopic retrograde cholangiopancreatogram.  This involves inserting a small tube with a camera on the end (endoscope) through your mouth to look at bile ducts and check for blockages. How is this treated? Treatment for this condition depends on the severity of the condition. Silent gallstones do not need treatment. Treatment may be needed if a blockage causes a gallbladder attack or other symptoms. Treatment may include: Home care, if symptoms are not severe. During a simple gallbladder attack, stop eating and drinking for 12-24 hours (except for water and clear liquids). This helps to "cool down" your gallbladder. After 1 or 2 days, you can start to eat a diet of simple or clear foods, such as broths and crackers. You may also need medicines for pain or nausea or both. If you have cholecystitis and an infection, you will need antibiotics. A hospital stay, if needed for pain control or for cholecystitis with severe infection. Cholecystectomy, or surgery to remove your gallbladder. This is the most common treatment if all other treatments have not worked. Medicines to break up gallstones. These are most effective at treating small gallstones. Medicines may be used for up to 6-12 months. Endoscopic retrograde cholangiopancreatogram. A small basket can be attached to the endoscope and used to capture and remove gallstones, mainly those that are in the common bile duct. Follow these instructions at home: Medicines Take over-the-counter and prescription medicines only as told by your health care provider. If you were prescribed an antibiotic medicine, take it as told by your health care provider. Do not stop taking the antibiotic even if you start to feel better. Ask your health care provider if the medicine prescribed to you requires you to avoid driving or using machinery. Eating and drinking Drink enough fluid to keep your urine pale yellow. This is important during a gallbladder attack. Water and clear liquids are preferred. Follow a healthy  diet. This includes: Reducing fatty foods, such as fried food and foods high in cholesterol. Reducing refined carbohydrates, such as white bread and white rice. Eating more fiber. Aim for foods such as almonds, fruit, and beans. Alcohol use If you drink alcohol: Limit how much you use to: 0-1 drink a day for nonpregnant women. 0-2 drinks a day for men. Be aware of how much alcohol is in your drink. In the [place] one drink equals one 12 oz bottle of beer (355 mL), one 5 oz glass of wine (148 mL), or one 1 oz glass of hard liquor (44 mL). General instructions Do not use any products that contain nicotine or tobacco, such as cigarettes, e-cigarettes, and chewing tobacco. If you need help quitting, ask your health care provider. Maintain a healthy weight. Keep all follow-up visits as told by your health care provider. These may include consultations with a surgeon or specialist. This is important. Where to find more information National Institute of Diabetes and Digestive and Kidney Diseases: www.niddk.nih.gov Contact a health care provider if: You think you have had a gallbladder attack. You have been diagnosed with silent gallstones and you develop pain in your abdomen or indigestion. You begin to have attacks more often. You have dark urine or light-colored stools. Get help right away if: You have pain from a gallbladder attack that lasts for more than 2 hours. You have pain in your abdomen that lasts for more than 5 hours or is getting worse.   You have a fever or chills. You have nausea and vomiting that do not go away. You develop jaundice. Summary Cholelithiasis is a disease in which gallstones form in the gallbladder. This condition may be caused by an imbalance in the different parts that make bile. This can happen if your bile has too much bilirubin or cholesterol, or does not have enough bile salts. Treatment for gallstones depends on the severity of the condition. Silent  gallstones do not need treatment. If gallstones cause a gallbladder attack or other symptoms, treatment usually involves not eating or drinking anything. Treatment may also include pain medicines and antibiotics, and it sometimes includes a hospital stay. Surgery to remove the gallbladder is common if all other treatments have not worked. This information is not intended to replace advice given to you by your health care provider. Make sure you discuss any questions you have with your health care provider. Document Revised: 09/27/2019 Document Reviewed: 09/27/2019 Elsevier Patient Education  2023 Elsevier Inc.  

## 2022-06-03 NOTE — Progress Notes (Signed)
Patient ID: Holly Wu, female   DOB: 06-17-1996, 26 y.o.   MRN: [id number]  HPI Holly Wu is a 26 y.o. female seen in consultation at the request of Dr. Tamala Julian.   She Came into the emergency room last week complaining of epigastric pain serous with nausea and vomiting.  She has had symptoms for the last 2 months or so.  Over the last few weeks have been worsening.  No fevers no chills no evidence of.  Obstruction.  No jaundice.  There are members of the family including mom that siblings have had their gallbladders removed.  Good success She is 2 months out from a C-section.  She did require iron  infusion due to significant anemia. Hemoglobin 12.3 white count is 10 and platelets are normal.  CMP shows mild elevation of the AST and ALT likely from cholestasis nml TB and Alk P. He is able to perform more than 4 METS of duty without any shortness or chest pain  HPI  Past Medical History:  Diagnosis Date   EKG abnormalities    PCOS (polycystic ovarian syndrome)    Right bundle branch block 2017   Scoliosis     Past Surgical History:  Procedure Laterality Date   CESAREAN SECTION  04/08/2022   Procedure: CESAREAN SECTION;  Surgeon: Rubie Maid, MD;  Location: [facility] ORS;  Service: Obstetrics;;   ESOPHAGUS SURGERY     foreign object removed    Family History  Problem Relation Age of Onset   Diabetes Mother    Healthy Father    Colon cancer Maternal Grandfather    Breast cancer Paternal Grandmother    Colon cancer Maternal Aunt    Breast cancer Other 3    Social History Social History   Tobacco Use   Smoking status: Never   Smokeless tobacco: Never  Vaping Use   Vaping Use: Some days  Substance Use Topics   Alcohol use: No   Drug use: No    No Known Allergies  Current Outpatient Medications  Medication Sig Dispense Refill   acetaminophen (TYLENOL) 500 MG tablet Take 2 tablets (1,000 mg total) by mouth every 6 (six) hours. 30 tablet 0   ferrous  sulfate 325 (65 FE) MG tablet Take 1 tablet (325 mg total) by mouth every other day.  3   ibuprofen (ADVIL) 600 MG tablet Take 1 tablet (600 mg total) by mouth every 6 (six) hours. 30 tablet 0   [START ON 07/04/2022] medroxyPROGESTERone (DEPO-PROVERA) 150 MG/ML injection Inject 1 mL (150 mg total) into the muscle once for 1 dose. 1 mL 3   No current facility-administered medications for this visit.     Review of Systems Full ROS  was asked and was negative except for the information on the HPI  Physical Exam Blood pressure 130/79, pulse 66, temperature 98.6 F (37 C), temperature source Oral, height '5\' 2"'  (1.575 m), weight 186 lb 12.8 oz (84.7 kg), SpO2 100 %, unknown if currently breastfeeding. CONSTITUTIONAL: NAD. EYES: Pupils are equal, round,, Sclera are non-icteric. EARS, NOSE, MOUTH AND THROAT: . The oral mucosa is pink and moist. Hearing is intact to voice. LYMPH NODES:  Lymph nodes in the neck are normal. RESPIRATORY:  Lungs are clear. There is normal respiratory effort, with equal breath sounds bilaterally, and without pathologic use of accessory muscles. CARDIOVASCULAR: Heart is regular without murmurs, gallops, or rubs. GI: The abdomen is  soft, nontender, and nondistended. There are no palpable masses. There is no hepatosplenomegaly.  There are normal bowel sounds  Evidence of prior primary stabilization without evidence of active infection.  There is some likely fungal changes within the skin. GU: Rectal deferred.   MUSCULOSKELETAL: Normal muscle strength and tone. No cyanosis or edema.   SKIN: Turgor is good and there are no pathologic skin lesions or ulcers. NEUROLOGIC: Motor and sensation is grossly normal. Cranial nerves are grossly intact. PSYCH:  Oriented to person, place and time. Affect is normal.  Data Reviewed  I have personally reviewed the patient's imaging, laboratory findings and medical records.    Assessment/Plan 26 year old female with.  Colic and  gallstones.  Discussed with patient detail about her disease process.  I do recommend cholecystectomy.  I do think a robotic approach is optimal for her The risks, benefits, complications, treatment options, and expected outcomes were discussed with the patient. The possibilities of bleeding, recurrent infection, finding a normal gallbladder, perforation of viscus organs, damage to surrounding structures, bile leak, abscess formation, needing a drain placed, the need for additional procedures, reaction to medication, pulmonary aspiration,  failure to diagnose a condition, the possible need to convert to an open procedure, and creating a complication requiring transfusion or operation were discussed with the patient. The patient and/or family concurred with the proposed plan, giving informed consent.   A copy of this report was sent to the referring provider I spent 45 minutes's encounter including coordination of care, personally reviewing images studies, counseling the patient, placing orders and performing appropriate documentation  Caroleen Hamman, MD FACS General Surgeon 06/03/2022, 2:13 PM

## 2022-06-04 ENCOUNTER — Telehealth: Payer: Self-pay | Admitting: Surgery

## 2022-06-04 NOTE — Telephone Encounter (Signed)
Multiple attempts in trying to call, phone keeps disconnecting. Not able to leave message.  If patient calls, please inform her of the following regarding scheduled surgery.    Surgery Date: 06/07/22 Preadmission Testing Date: 06/05/22 (phone 8a-1p)  Also, patient will need to call at [phone], between 1-3:00pm the day before surgery, to find out what time to arrive for surgery.

## 2022-06-05 ENCOUNTER — Inpatient Hospital Stay
Admission: RE | Admit: 2022-06-05 | Discharge: 2022-06-05 | Disposition: A | Discharge status: Home / Self Care | Payer: Federal, State, Local not specified - PPO | Source: Ambulatory Visit

## 2022-06-05 NOTE — Patient Instructions (Signed)
Your procedure is scheduled on: Friday June 07, 2022. Report to [facility] inside [facility] 2nd floor, stop by admissions desk before getting on elevator.  To find out your arrival time please call [phone] between 1PM - 3PM on Thursday June 06, 2022.  Remember: Instructions that are not followed completely may result in serious medical risk,  up to and including death, or upon the discretion of your surgeon and anesthesiologist your  surgery may need to be rescheduled.     _X__ 1. Do not eat food after midnight the night before your procedure.                 No chewing gum or hard candies. You may drink clear liquids up to 2 hours                 before you are scheduled to arrive for your surgery- DO not drink clear                 liquids within 2 hours of the start of your surgery.                 Clear Liquids include:  water, apple juice without pulp, clear Gatorade, G2 or                  Gatorade Zero (avoid Red/Purple/Blue), Black Coffee or Tea (Do not add                 anything to coffee or tea).  __X__2.  On the morning of surgery brush your teeth with toothpaste and water, you                may rinse your mouth with mouthwash if you wish.  Do not swallow any toothpaste or mouthwash.     _X__ 3.  No Alcohol for 24 hours before or after surgery.   _X__ 4.  Do Not Smoke or use e-cigarettes For 24 Hours Prior to Your Surgery.                 Do not use any chewable tobacco products for at least 6 hours prior to                 Surgery.  _X__  5.  Do not use any recreational drugs (marijuana, cocaine, heroin, ecstasy, MDMA or other)                For at least one week prior to your surgery.  Combination of these drugs with anesthesia                May have life threatening results.  ____  6.  Bring all medications with you on the day of surgery if instructed.   __X__  7.  Notify your doctor if there is any change in your medical condition       (cold, fever, infections).     Do not wear jewelry, make-up, hairpins, clips or nail polish. Do not wear lotions, powders, or perfumes. You may wear deodorant. Do not shave 48 hours prior to surgery. Men may shave face and neck. Do not bring valuables to the hospital.    [facility] is not responsible for any belongings or valuables.  Contacts, dentures or bridgework may not be worn into surgery. Leave your suitcase in the car. After surgery it may be brought to your room. For patients admitted to the hospital, discharge time is  determined by your treatment team.   Patients discharged the day of surgery will not be allowed to drive home.   Make arrangements for someone to be with you for the first 24 hours of your Same Day Discharge.  __X__ Take these medicines the morning of surgery with A SIP OF WATER:    1. None   2.   3.   4.  5.  6.  ____ Fleet Enema (as directed)   __X__ Use CHG Soap (or wipes) as directed  ____ Use Benzoyl Peroxide Gel as instructed  ____ Use inhalers on the day of surgery  ____ Stop metformin 2 days prior to surgery    ____ Take 1/2 of usual insulin dose the night before surgery. No insulin the morning          of surgery.   ____ Call your PCP, cardiologist, or Pulmonologist if taking Coumadin/Plavix/aspirin and ask when to stop before your surgery.   __X__ One Week prior to surgery- Stop Anti-inflammatories such as Ibuprofen, Aleve, Advil, Motrin, meloxicam (MOBIC), diclofenac, etodolac, ketorolac, Toradol, Daypro, piroxicam, Goody's or BC powders. OK TO USE TYLENOL IF NEEDED   __X__ Stop supplements until after surgery.    ____ Bring C-Pap to the hospital.    If you have any questions regarding your pre-procedure instructions,  Please call Pre-admit Testing at [phone]

## 2022-06-05 NOTE — Telephone Encounter (Signed)
Surgery is rescheduled to a later date at patient's request.  Have been trying to get in touch with her, phone rings a few times then disconnects.  Preadmissions has also been trying to get in touch with the patient.  If patient calls, please inform her of the following regarding updated rescheduled surgery:   Surgery Date: 06/18/22 Preadmission Testing Date: 06/05/22 (phone 1p-5p)  Also, patient will need to call at [phone], between 1-3:00pm the day before surgery, to find out what time to arrive for surgery.

## 2022-06-06 NOTE — Telephone Encounter (Signed)
Outgoing call is made again, unable to leave a message.  Phone keeps disconnecting.   Preadmissions also trying to get in touch with patient.  Her preop date has been rescheduled to 06/12/22, they will call between 8:00 -1:00.  Surgery still scheduled for 06/18/22.  If patient calls back, please give her updated information.

## 2022-06-10 NOTE — Telephone Encounter (Signed)
Outgoing call, unable to leave message again.

## 2022-06-12 ENCOUNTER — Inpatient Hospital Stay
Admission: RE | Admit: 2022-06-12 | Discharge: 2022-06-12 | Disposition: A | Discharge status: Home / Self Care | Payer: Federal, State, Local not specified - PPO | Source: Ambulatory Visit

## 2022-06-12 ENCOUNTER — Encounter: Payer: Self-pay | Admitting: Emergency Medicine

## 2022-06-12 ENCOUNTER — Emergency Department: Payer: Federal, State, Local not specified - PPO

## 2022-06-12 ENCOUNTER — Other Ambulatory Visit: Payer: Self-pay

## 2022-06-12 ENCOUNTER — Observation Stay
Admission: EM | Admit: 2022-06-12 | Discharge: 2022-06-13 | Disposition: A | Discharge status: Home / Self Care | Payer: Federal, State, Local not specified - PPO | Attending: Surgery | Admitting: Surgery

## 2022-06-12 DIAGNOSIS — Z79899 Other long term (current) drug therapy: Secondary | ICD-10-CM | POA: Insufficient documentation

## 2022-06-12 DIAGNOSIS — K810 Acute cholecystitis: Secondary | ICD-10-CM | POA: Diagnosis not present

## 2022-06-12 DIAGNOSIS — K8020 Calculus of gallbladder without cholecystitis without obstruction: Secondary | ICD-10-CM

## 2022-06-12 DIAGNOSIS — R1110 Vomiting, unspecified: Secondary | ICD-10-CM | POA: Diagnosis not present

## 2022-06-12 DIAGNOSIS — K8012 Calculus of gallbladder with acute and chronic cholecystitis without obstruction: Principal | ICD-10-CM | POA: Insufficient documentation

## 2022-06-12 DIAGNOSIS — R109 Unspecified abdominal pain: Secondary | ICD-10-CM | POA: Diagnosis not present

## 2022-06-12 DIAGNOSIS — R112 Nausea with vomiting, unspecified: Secondary | ICD-10-CM | POA: Diagnosis not present

## 2022-06-12 DIAGNOSIS — R935 Abnormal findings on diagnostic imaging of other abdominal regions, including retroperitoneum: Secondary | ICD-10-CM | POA: Diagnosis not present

## 2022-06-12 LAB — CBC
HCT: 41.4 % (ref 36.0–46.0)
Hemoglobin: 12.7 g/dL (ref 12.0–15.0)
MCH: 25.7 pg — ABNORMAL LOW (ref 26.0–34.0)
MCHC: 30.7 g/dL (ref 30.0–36.0)
MCV: 83.8 fL (ref 80.0–100.0)
Platelets: 246 10*3/uL (ref 150–400)
RBC: 4.94 MIL/uL (ref 3.87–5.11)
RDW: 16.2 % — ABNORMAL HIGH (ref 11.5–15.5)
WBC: 8.7 10*3/uL (ref 4.0–10.5)
nRBC: 0 % (ref 0.0–0.2)

## 2022-06-12 LAB — COMPREHENSIVE METABOLIC PANEL
ALT: 43 U/L (ref 0–44)
AST: 52 U/L — ABNORMAL HIGH (ref 15–41)
Albumin: 4.2 g/dL (ref 3.5–5.0)
Alkaline Phosphatase: 80 U/L (ref 38–126)
Anion gap: 7 (ref 5–15)
BUN: 15 mg/dL (ref 6–20)
CO2: 26 mmol/L (ref 22–32)
Calcium: 9 mg/dL (ref 8.9–10.3)
Chloride: 108 mmol/L (ref 98–111)
Creatinine, Ser: 0.81 mg/dL (ref 0.44–1.00)
GFR, Estimated: >60 mL/min (ref >60)
Glucose, Bld: 94 mg/dL (ref 70–99)
Potassium: 3.9 mmol/L (ref 3.5–5.1)
Sodium: 141 mmol/L (ref 135–145)
Total Bilirubin: 0.3 mg/dL (ref 0.3–1.2)
Total Protein: 7.5 g/dL (ref 6.5–8.1)

## 2022-06-12 LAB — HCG, QUANTITATIVE, PREGNANCY: hCG, Beta Chain, Quant, S: 1 m[IU]/mL (ref <5)

## 2022-06-12 LAB — LIPASE, BLOOD: Lipase: 35 U/L (ref 11–51)

## 2022-06-12 MED ORDER — PROCHLORPERAZINE MALEATE 10 MG PO TABS: 10.0000 mg | ORAL_TABLET | Freq: Four times a day (QID) | ORAL | Status: DC | PRN

## 2022-06-12 MED ORDER — CEFAZOLIN SODIUM-DEXTROSE 2-4 GM/100ML-% IV SOLN
2.0000 g | INTRAVENOUS | Status: AC
  Administered 2022-06-13: 2 g via INTRAVENOUS
  Filled 2022-06-12: qty 100

## 2022-06-12 MED ORDER — ACETAMINOPHEN 500 MG PO TABS: 1000.0000 mg | ORAL_TABLET | ORAL | Status: DC

## 2022-06-12 MED ORDER — DIPHENHYDRAMINE HCL 50 MG/ML IJ SOLN
25.0000 mg | INTRAMUSCULAR | Status: AC
  Administered 2022-06-12: 25 mg via INTRAVENOUS
  Filled 2022-06-12: qty 1

## 2022-06-12 MED ORDER — CHLORHEXIDINE GLUCONATE CLOTH 2 % EX PADS
6.0000 | MEDICATED_PAD | Freq: Once | CUTANEOUS | Status: AC
  Administered 2022-06-13: 6 via TOPICAL

## 2022-06-12 MED ORDER — ONDANSETRON HCL 4 MG/2ML IJ SOLN
4.0000 mg | Freq: Once | INTRAMUSCULAR | Status: AC
  Administered 2022-06-12: 4 mg via INTRAVENOUS
  Filled 2022-06-12: qty 2

## 2022-06-12 MED ORDER — ACETAMINOPHEN 500 MG PO TABS
1000.0000 mg | ORAL_TABLET | Freq: Four times a day (QID) | ORAL | Status: DC
  Administered 2022-06-12 – 2022-06-13 (×4): 1000 mg via ORAL
  Filled 2022-06-12 (×4): qty 2

## 2022-06-12 MED ORDER — IOHEXOL 300 MG/ML  SOLN
100.0000 mL | Freq: Once | INTRAMUSCULAR | Status: AC | PRN
  Administered 2022-06-12: 100 mL via INTRAVENOUS

## 2022-06-12 MED ORDER — IBUPROFEN 600 MG PO TABS: 600.0000 mg | ORAL_TABLET | Freq: Four times a day (QID) | ORAL | Status: DC

## 2022-06-12 MED ORDER — SODIUM CHLORIDE 0.9 % IV SOLN
2.0000 g | INTRAVENOUS | Status: DC
  Administered 2022-06-12: 2 g via INTRAVENOUS
  Filled 2022-06-12 (×2): qty 20

## 2022-06-12 MED ORDER — CELECOXIB 200 MG PO CAPS
200.0000 mg | ORAL_CAPSULE | ORAL | Status: AC
  Administered 2022-06-13: 200 mg via ORAL
  Filled 2022-06-12: qty 1

## 2022-06-12 MED ORDER — SODIUM CHLORIDE 0.9 % IV SOLN
INTRAVENOUS | Status: DC
Start: 2022-06-12 — End: 2022-06-14

## 2022-06-12 MED ORDER — GABAPENTIN 300 MG PO CAPS
300.0000 mg | ORAL_CAPSULE | ORAL | Status: AC
  Administered 2022-06-13: 300 mg via ORAL

## 2022-06-12 MED ORDER — OXYCODONE HCL 5 MG PO TABS: 5.0000 mg | ORAL_TABLET | ORAL | Status: DC | PRN

## 2022-06-12 MED ORDER — HYDROMORPHONE HCL 1 MG/ML IJ SOLN: 0.5000 mg | INTRAMUSCULAR | Status: DC | PRN

## 2022-06-12 MED ORDER — HYDROMORPHONE HCL 1 MG/ML IJ SOLN
1.0000 mg | Freq: Once | INTRAMUSCULAR | Status: AC
  Administered 2022-06-12: 1 mg via INTRAVENOUS
  Filled 2022-06-12: qty 1

## 2022-06-12 MED ORDER — MORPHINE SULFATE (PF) 4 MG/ML IV SOLN
4.0000 mg | Freq: Once | INTRAVENOUS | Status: AC
  Administered 2022-06-12: 4 mg via INTRAVENOUS
  Filled 2022-06-12: qty 1

## 2022-06-12 MED ORDER — PROCHLORPERAZINE EDISYLATE 10 MG/2ML IJ SOLN: 5.0000 mg | Freq: Four times a day (QID) | INTRAMUSCULAR | Status: DC | PRN

## 2022-06-12 MED ORDER — KETOROLAC TROMETHAMINE 15 MG/ML IJ SOLN
15.0000 mg | Freq: Once | INTRAMUSCULAR | Status: AC
  Administered 2022-06-12: 15 mg via INTRAVENOUS
  Filled 2022-06-12: qty 1

## 2022-06-12 NOTE — ED Notes (Signed)
Informed RN bed assigned 

## 2022-06-12 NOTE — ED Provider Triage Note (Signed)
Emergency Medicine Provider Triage Evaluation Note  Holly Wu , a 26 y.o. female  was evaluated in triage.  Pt complains of N/V and abdominal pain.  Has known gallbladder disease, scheduled for cholecystectomy on 8/1.  Worsening symptoms tonight.  Review of Systems  Positive: N/V and abdominal pain Negative: Fever, chest pain, SOB  Physical Exam  BP 98/60 (BP Location: Left Arm)   Pulse 69   Temp 98.4 F (36.9 C) (Oral)   Resp 17   SpO2 100%   Breastfeeding No  Gen:   Awake, no obvious distress   Resp:  Normal effort  MSK:   Moves extremities without difficulty  Other:  acute allergic reaction with erythema and urticaria around PIV site in right [facility], started immediately after patient received Toradol 15 mg IV and Zofran 4 mg IV.  Medical Decision Making  Medically screening exam initiated at 6:36 AM.  Appropriate orders placed.  Holly Wu was informed that the remainder of the evaluation will be completed by another provider, this initial triage assessment does not replace that evaluation, and the importance of remaining in the ED until their evaluation is complete.  Patient has no known allergies.  I ordered morphine 4 mg IV, Zofran 4 mg IV, and Toradol 15 mg IV from triage, and allergic reaction occurred after the Zofran and Toradol.  Patient reports she has had Zofran before without any issues but does not think she has ever had Toradol.  I ordered Benadryl 25 mg IV to treat.  She has no respiratory symptoms or indication of anaphylaxis at this time.  Basic labs ordered and ordered repeat ultrasound to look for evidence of cholecystitis and/or immobile gallstone in the neck of the gallbladder.     Loleta Rose, MD 06/12/22 [phone]

## 2022-06-12 NOTE — Patient Instructions (Signed)
Your procedure is scheduled on: Tuesday June 18, 2022. Report to Day [facility] inside [facility] 2nd floor, stop by admissions desk before getting on elevator. To find out your arrival time please call [phone] between 1PM - 3PM on Monday June 17, 2022.  Remember: Instructions that are not followed completely may result in serious medical risk,  up to and including death, or upon the discretion of your surgeon and anesthesiologist your  surgery may need to be rescheduled.     _X__ 1. Do not eat food after midnight the night before your procedure.                 No chewing gum or hard candies. You may drink clear liquids up to 2 hours                 before you are scheduled to arrive for your surgery- DO not drink clear                 liquids within 2 hours of the start of your surgery.                 Clear Liquids include:  water, apple juice without pulp, clear Gatorade, G2 or                  Gatorade Zero (avoid Red/Purple/Blue), Black Coffee or Tea (Do not add                 anything to coffee or tea).  __X__2.  On the morning of surgery brush your teeth with toothpaste and water, you                may rinse your mouth with mouthwash if you wish.  Do not swallow any toothpaste or mouthwash.     _X__ 3.  No Alcohol for 24 hours before or after surgery.   _X__ 4.  Do Not Smoke or use e-cigarettes For 24 Hours Prior to Your Surgery.                 Do not use any chewable tobacco products for at least 6 hours prior to                 Surgery.  _X__  5.  Do not use any recreational drugs (marijuana, cocaine, heroin, ecstasy, MDMA or other)                For at least one week prior to your surgery.  Combination of these drugs with anesthesia                May have life threatening results.  ____  6.  Bring all medications with you on the day of surgery if instructed.   __X__  7.  Notify your doctor if there is any change in your medical condition       (cold, fever, infections).     Do not wear jewelry, make-up, hairpins, clips or nail polish. Do not wear lotions, powders, or perfumes. You may wear deodorant. Do not shave 48 hours prior to surgery. Men may shave face and neck. Do not bring valuables to the hospital.    [facility] is not responsible for any belongings or valuables.  Contacts, dentures or bridgework may not be worn into surgery. Leave your suitcase in the car. After surgery it may be brought to your room. For patients admitted to the hospital, discharge time is determined  by your treatment team.   Patients discharged the day of surgery will not be allowed to drive home.   Make arrangements for someone to be with you for the first 24 hours of your Same Day Discharge.   __X__ Take these medicines the morning of surgery with A SIP OF WATER:    1. None   2.   3.   4.  5.  6.  ____ Fleet Enema (as directed)   __X__ Use CHG Soap (or wipes) as directed  ____ Use Benzoyl Peroxide Gel as instructed  ____ Use inhalers on the day of surgery  ____ Stop metformin 2 days prior to surgery    ____ Take 1/2 of usual insulin dose the night before surgery. No insulin the morning          of surgery.   ____ Call your PCP, cardiologist, or Pulmonologist if taking Coumadin/Plavix/aspirin and ask when to stop before your surgery.   __X__ One Week prior to surgery- Stop Anti-inflammatories such as Ibuprofen, Aleve, Advil, Motrin, meloxicam (MOBIC), diclofenac, etodolac, ketorolac, Toradol, Daypro, piroxicam, Goody's or BC powders. OK TO USE TYLENOL IF NEEDED   __X__ Do not start any new vitamins and or herbal supplements until after surgery.    ____ Bring C-Pap to the hospital.    If you have any questions regarding your pre-procedure instructions,  Please call Pre-admit Testing at [phone]

## 2022-06-12 NOTE — H&P (Signed)
[facility] SURGICAL HISTORY & PHYSICAL (cpt [phone])  HISTORY OF PRESENT ILLNESS (HPI):  26 y.o. female presented to [facility] ED today for abdominal pain. Patient is known to our service and has been following Dr Everlene Farrier as an outpatient for biliary colic and cholelithiasis. She was tentatively scheduled for robotic assisted laparoscopic cholecystectomy on 08/01. Patient reports the acute onset of RUQ abdominal pain around 0400 this morning. This has been severe in nature. Nothing seemed to make this better. She notes accompanying nausea and emesis with the pain. This is similar to her previous episodes of pain. No fever, chills, CP, SOB, or bowel changes. Only previous intra-abdominal surgery is C-section. Laboratory work up in the ED was reassuring. She underwent RUQ, MRCP, and CT Abdomen/Pelvis which showed cholelithiasis without evidence of cholecystitis nor choledocholithiasis. She was PO challenged in the ED but unfortunately failed. Continues to have 5/10 RUQ pain.   General surgery is consulted by emergency medicine physician Dr Sharman Cheek, MD for evaluation and management of symptomatic cholelithiasis with intractable pain.    PAST MEDICAL HISTORY (PMH):  Past Medical History:  Diagnosis Date   EKG abnormalities    PCOS (polycystic ovarian syndrome)    Right bundle branch block 2017   Scoliosis     Reviewed. Otherwise negative.   PAST SURGICAL HISTORY (PSH):  Past Surgical History:  Procedure Laterality Date   CESAREAN SECTION  04/08/2022   Procedure: CESAREAN SECTION;  Surgeon: Hildred Laser, MD;  Location: [facility] ORS;  Service: Obstetrics;;   ESOPHAGUS SURGERY     foreign object removed    Reviewed. Otherwise negative.   MEDICATIONS:  Prior to Admission medications   Medication Sig Start Date End Date Taking? Authorizing Provider  acetaminophen (TYLENOL) 500 MG tablet Take 2 tablets (1,000 mg total) by mouth every 6 (six) hours. 04/11/22   Dominic, Courtney Heys,  CNM  ferrous sulfate 325 (65 FE) MG tablet Take 1 tablet (325 mg total) by mouth every other day. 04/13/22   Dominic, Courtney Heys, CNM  ibuprofen (ADVIL) 600 MG tablet Take 1 tablet (600 mg total) by mouth every 6 (six) hours. 04/11/22   Dominic, Courtney Heys, CNM  medroxyPROGESTERone (DEPO-PROVERA) 150 MG/ML injection Inject 1 mL (150 mg total) into the muscle once for 1 dose. 07/04/22 07/04/22  Dominic, Courtney Heys, CNM     ALLERGIES:  Allergies  Allergen Reactions   Toradol [Ketorolac Tromethamine] Rash   Zofran [Ondansetron] Rash     SOCIAL HISTORY:  Social History   Socioeconomic History   Marital status: Single    Spouse name: Not on file   Number of children: Not on file   Years of education: Not on file   Highest education level: Not on file  Occupational History   Not on file  Tobacco Use   Smoking status: Never   Smokeless tobacco: Never  Vaping Use   Vaping Use: Some days  Substance and Sexual Activity   Alcohol use: No   Drug use: No   Sexual activity: Yes    Birth control/protection: Pill  Other Topics Concern   Not on file  Social History Narrative   Not on file   Social Determinants of Health   Financial Resource Strain: Not on file  Food Insecurity: Not on file  Transportation Needs: Not on file  Physical Activity: Not on file  Stress: Not on file  Social Connections: Not on file  Intimate Partner Violence: Not on file     FAMILY HISTORY:  Family History  Problem Relation Age of Onset   Diabetes Mother    Healthy Father    Colon cancer Maternal Grandfather    Breast cancer Paternal Grandmother    Colon cancer Maternal Aunt    Breast cancer Other 60    Otherwise negative.   REVIEW OF SYSTEMS:  Review of Systems  Constitutional:  Negative for chills and fever.  HENT:  Negative for congestion and sore throat.   Respiratory:  Negative for cough and shortness of breath.   Cardiovascular:  Negative for chest pain and palpitations.   Gastrointestinal:  Positive for abdominal pain, nausea and vomiting. Negative for constipation and diarrhea.  Genitourinary:  Negative for dysuria and urgency.  All other systems reviewed and are negative.   VITAL SIGNS:  Temp:  [98.3 F (36.8 C)-98.4 F (36.9 C)] 98.3 F (36.8 C) (07/26 1000) Pulse Rate:  [44-69] 44 (07/26 1323) Resp:  [10-19] 10 (07/26 1323) BP: (98-132)/(56-83) 99/71 (07/26 1323) SpO2:  [97 %-100 %] 99 % (07/26 1323) Weight:  [84 kg] 84 kg (07/26 0739)     Height: 5\' 2"  (157.5 cm) Weight: 84 kg BMI (Calculated): 33.86   PHYSICAL EXAM:  Physical Exam Vitals and nursing note reviewed. Exam conducted with a chaperone present.  Constitutional:      General: She is not in acute distress.    Appearance: Normal appearance. She is obese. She is not ill-appearing.  HENT:     Head: Normocephalic and atraumatic.  Eyes:     General: No scleral icterus.    Conjunctiva/sclera: Conjunctivae normal.  Cardiovascular:     Rate and Rhythm: Normal rate.     Pulses: Normal pulses.     Heart sounds: No murmur heard. Pulmonary:     Effort: Pulmonary effort is normal. No respiratory distress.  Abdominal:     General: A surgical scar is present. There is no distension.     Palpations: Abdomen is soft.     Tenderness: There is abdominal tenderness in the right upper quadrant. There is no guarding or rebound. Negative signs include Murphy's sign.     Comments: Abdomen is soft, tender in RUQ, non-distended, no rebound/guarding. Murphy's Sign is negative.   Genitourinary:    Comments: Deferred Musculoskeletal:     Right lower leg: No edema.     Left lower leg: No edema.  Skin:    General: Skin is warm and dry.     Coloration: Skin is not jaundiced.     Findings: No erythema.  Neurological:     General: No focal deficit present.     Mental Status: She is alert and oriented to person, place, and time.  Psychiatric:        Mood and Affect: Mood normal.        Behavior:  Behavior normal.     INTAKE/OUTPUT:  This shift: No intake/output data recorded.  Last 2 shifts: @IOLAST2SHIFTS @  Labs:     Latest Ref Rng & Units 06/12/2022    6:17 AM 05/28/2022    2:17 PM 04/11/2022    8:37 AM  CBC  WBC 4.0 - 10.5 K/uL 8.7  10.2  16.1   Hemoglobin 12.0 - 15.0 g/dL 07/29/2022  04/13/2022  8.4   Hematocrit 36.0 - 46.0 % 41.4  40.5  27.4   Platelets 150 - 400 K/uL 246  275  409       Latest Ref Rng & Units 06/12/2022    6:17 AM 05/28/2022    2:17  PM 04/07/2022   10:55 PM  CMP  Glucose 70 - 99 mg/dL 94  732    BUN 6 - 20 mg/dL 15  12    Creatinine 2.02 - 1.00 mg/dL 5.42  7.06  2.37   Sodium 135 - 145 mmol/L 141  138    Potassium 3.5 - 5.1 mmol/L 3.9  4.1    Chloride 98 - 111 mmol/L 108  109    CO2 22 - 32 mmol/L 26  21    Calcium 8.9 - 10.3 mg/dL 9.0  9.1    Total Protein 6.5 - 8.1 g/dL 7.5  7.6    Total Bilirubin 0.3 - 1.2 mg/dL 0.3  0.8    Alkaline Phos 38 - 126 U/L 80  99    AST 15 - 41 U/L 52  105    ALT 0 - 44 U/L 43  58      Imaging studies:   RUS [place] (06/12/2022) personally reviewed showing known gallstones, no evidence of cholecystitis, and radiologist report reviewed below:  IMPRESSION: 1. In addition to gallstones, there is evidence suggestive of choledocholithiasis. Common bile duct is mildly prominent but not frankly dilated. This could indicate very early biliary tract obstruction. Further evaluation with abdominal MRI with MRCP could be considered to more definitively evaluate this. 2. Cholelithiasis without evidence of acute cholecystitis.   MRCP (06/12/2022) personally reviewed which is negative for choledocholithiasis, and radiologist report reviewed below:  IMPRESSION: 1. Cholelithiasis. 2. No choledocholithiasis. 3. Normal biliary tree.   CT Abdomen/Pelvis (06/12/2022) personally reviewed showing cholelithiasis, no cholecystitis, no other acute intra-abdominal findings, and radiologist report reviewed below:  IMPRESSION: No acute  findings.   Cholelithiasis. No radiographic evidence of cholecystitis or biliary ductal dilatation.   Assessment/Plan: (ICD-10's: K35.20) 26 y.o. female with intractable RUQ abdominal pain likely secondary to symptomatic cholelithiasis without gross evidence of cholecystitis nor choledocholithiasis.    - Given intractable pain, we will admit to general surgery and plan on robotic assisted laparoscopic cholecystectomy tomorrow (07/27) with Dr Everlene Farrier pending OR/Anesthesia availability - All risks, benefits, and alternatives to above procedure(s) were discussed with the patient, all of her questions were answered to her expressed satisfaction, patient expresses she wishes to proceed, and informed consent was obtained.    - Okay for CLD tonight; NPO at midnight - Will start  Abx (Rocephin) as precaution and as she will need pre-operative Abx - IVF resuscitation - Monitor abdominal examination - Pain control prn; antiemetics prn    - Morning labs   - Mobilize as tolerated  - DVT prophylaxis; hold for OR  All of the above findings and recommendations were discussed with the patient and her family, and all of their questions were answered to their expressed satisfaction.  -- Lynden Oxford, PA-C [facility] 06/12/2022, 2:27 PM M-F: 7am - 4pm

## 2022-06-12 NOTE — ED Notes (Signed)
Patient transported to MRI 

## 2022-06-12 NOTE — ED Triage Notes (Signed)
Pt c/o N/V x1 hour this AM. Pt due to have gallbladder removed on 8/1 but instructed to come to ED if N/V occur.

## 2022-06-12 NOTE — ED Provider Notes (Signed)
[facility] Provider Note    Event Date/Time   First MD Initiated Contact with Patient 06/12/22 [phone]     (approximate)   History   Chief Complaint: Emesis and Nausea   HPI  Holly Wu is a 26 y.o. female with a history of cholelithiasis, scheduled for lap cholecystectomy June 18, 2022 who comes the ED complaining of nausea vomiting and right upper quadrant pain that started this morning at about 5:00 AM.  Constant, no aggravating or alleviating factors.  No fever.     Physical Exam   Triage Vital Signs: ED Triage Vitals  Enc Vitals Group     BP 06/12/22 0609 98/60     Pulse Rate 06/12/22 0609 69     Resp 06/12/22 0609 17     Temp 06/12/22 0609 98.4 F (36.9 C)     Temp Source 06/12/22 0609 Oral     SpO2 06/12/22 0609 100 %     Weight 06/12/22 0739 185 lb 3 oz (84 kg)     Height 06/12/22 0739 5\' 2"  (1.575 m)     Head Circumference --      Peak Flow --      Pain Score 06/12/22 0655 6     Pain Loc --      Pain Edu? --      Excl. in GC? --     Most recent vital signs: Vitals:   06/12/22 1330 06/12/22 1400  BP: 105/61 (!) 99/58  Pulse: (!) 44 (!) 51  Resp: 13 14  Temp:    SpO2: 100% 98%    General: Awake, no distress.  CV:  Good peripheral perfusion.  Regular rate rhythm Resp:  Normal effort.  Clear to auscultation bilaterally Abd:  No distention.  Soft with right upper quadrant tenderness. Other:  No lower extremity edema.   ED Results / Procedures / Treatments   Labs (all labs ordered are listed, but only abnormal results are displayed) Labs Reviewed  COMPREHENSIVE METABOLIC PANEL - Abnormal; Notable for the following components:      Result Value   AST 52 (*)    All other components within normal limits  CBC - Abnormal; Notable for the following components:   MCH 25.7 (*)    RDW 16.2 (*)    All other components within normal limits  LIPASE, BLOOD  HCG, QUANTITATIVE, PREGNANCY  URINALYSIS, ROUTINE W REFLEX  MICROSCOPIC     EKG    RADIOLOGY Ultrasound right upper quadrant interpreted by me, shows cholelithiasis without cholecystitis.  Radiology report reviewed, suggesting 3 mm stone in the CBD.  MRCP negative for choledocholithiasis or cholecystitis.  CT abdomen pelvis negative for acute findings   PROCEDURES:  Procedures   MEDICATIONS ORDERED IN ED: Medications  ibuprofen (ADVIL) tablet 600 mg (has no administration in time range)  acetaminophen (TYLENOL) tablet 1,000 mg (has no administration in time range)  0.9 %  sodium chloride infusion (has no administration in time range)  cefTRIAXone (ROCEPHIN) 2 g in sodium chloride 0.9 % 100 mL IVPB (has no administration in time range)  oxyCODONE (Oxy IR/ROXICODONE) immediate release tablet 5-10 mg (has no administration in time range)  HYDROmorphone (DILAUDID) injection 0.5-1 mg (has no administration in time range)  prochlorperazine (COMPAZINE) tablet 10 mg (has no administration in time range)    Or  prochlorperazine (COMPAZINE) injection 5-10 mg (has no administration in time range)  ondansetron (ZOFRAN) injection 4 mg (4 mg Intravenous Given 06/12/22 0620)  morphine (  PF) 4 MG/ML injection 4 mg (4 mg Intravenous Given 06/12/22 0620)  ketorolac (TORADOL) 15 MG/ML injection 15 mg (15 mg Intravenous Given 06/12/22 0620)  diphenhydrAMINE (BENADRYL) injection 25 mg (25 mg Intravenous Given 06/12/22 0641)  HYDROmorphone (DILAUDID) injection 1 mg (1 mg Intravenous Given 06/12/22 1028)  iohexol (OMNIPAQUE) 300 MG/ML solution 100 mL (100 mLs Intravenous Contrast Given 06/12/22 1133)     IMPRESSION / MDM / ASSESSMENT AND PLAN / ED COURSE  I reviewed the triage vital signs and the nursing notes.                              Differential diagnosis includes, but is not limited to, cholecystitis, biliary colic, choledocholithiasis, pancreatitis, bowel obstruction, GI perforation  Patient's presentation is most consistent with acute presentation  with potential threat to life or bodily function.  Patient presents with right upper quadrant pain and tenderness with known gallstones.  Vital signs are unremarkable.  Lab panel is normal.  Ultrasound right upper quadrant is suggestive of choledocholithiasis so MRCP was obtained which is negative.  Discussed with Dr. Everlene Farrier who is scheduled to do cholecystectomy in 1 week.   ----------------------------------------- 2:39 PM on 06/12/2022 ----------------------------------------- Patient attempted p.o. trial, but unable to tolerate more than a few sips of clears before she had to stop due to feeling like she was going to throw up.  Pain still 5/10.  Still tender in the right upper quadrant.  Surgery will plan to admit.      FINAL CLINICAL IMPRESSION(S) / ED DIAGNOSES   Final diagnoses:  Acute cholecystitis     Rx / DC Orders   ED Discharge Orders     None        Note:  This document was prepared using Dragon voice recognition software and may include unintentional dictation errors.   Sharman Cheek, MD 06/12/22 [phone]

## 2022-06-12 NOTE — ED Notes (Signed)
Patient AOX4. Resp even, unlabored on RA. Localized allergic reaction with hives and enlarged vein above prior IV site to right arm. Patency confirmed and IV discontinued with catheter intact. 20 G placed to left AC and benadryl administered. Patient denies shortness of breath or throat tightening. States nausea has improved. Reports abdominal pain has improved and is now a 6/10.

## 2022-06-12 NOTE — ED Notes (Signed)
Pts IV site and above site red with whelps and pt reports itching. Pt received medication and reaction began after meds given. No allergy known per pt.

## 2022-06-13 ENCOUNTER — Encounter: Payer: Self-pay | Admitting: Surgery

## 2022-06-13 ENCOUNTER — Observation Stay: Payer: Federal, State, Local not specified - PPO | Admitting: General Practice

## 2022-06-13 ENCOUNTER — Other Ambulatory Visit: Payer: Self-pay

## 2022-06-13 ENCOUNTER — Encounter
Admission: EM | Disposition: A | Discharge status: Home / Self Care | Payer: Self-pay | Source: Home / Self Care | Attending: Emergency Medicine

## 2022-06-13 DIAGNOSIS — K8010 Calculus of gallbladder with chronic cholecystitis without obstruction: Secondary | ICD-10-CM

## 2022-06-13 DIAGNOSIS — K819 Cholecystitis, unspecified: Secondary | ICD-10-CM | POA: Diagnosis not present

## 2022-06-13 DIAGNOSIS — K8012 Calculus of gallbladder with acute and chronic cholecystitis without obstruction: Secondary | ICD-10-CM | POA: Diagnosis not present

## 2022-06-13 LAB — URINALYSIS, ROUTINE W REFLEX MICROSCOPIC
Bacteria, UA: NONE SEEN
Bilirubin Urine: NEGATIVE
Glucose, UA: NEGATIVE mg/dL
Hgb urine dipstick: NEGATIVE
Ketones, ur: NEGATIVE mg/dL
Nitrite: NEGATIVE
Protein, ur: NEGATIVE mg/dL
Specific Gravity, Urine: 1.041 — ABNORMAL HIGH (ref 1.005–1.030)
pH: 5 (ref 5.0–8.0)

## 2022-06-13 LAB — COMPREHENSIVE METABOLIC PANEL
ALT: 68 U/L — ABNORMAL HIGH (ref 0–44)
AST: 32 U/L (ref 15–41)
Albumin: 3.5 g/dL (ref 3.5–5.0)
Alkaline Phosphatase: 85 U/L (ref 38–126)
Anion gap: 4 — ABNORMAL LOW (ref 5–15)
BUN: 11 mg/dL (ref 6–20)
CO2: 23 mmol/L (ref 22–32)
Calcium: 8.1 mg/dL — ABNORMAL LOW (ref 8.9–10.3)
Chloride: 114 mmol/L — ABNORMAL HIGH (ref 98–111)
Creatinine, Ser: 0.78 mg/dL (ref 0.44–1.00)
GFR, Estimated: >60 mL/min (ref >60)
Glucose, Bld: 82 mg/dL (ref 70–99)
Potassium: 4.1 mmol/L (ref 3.5–5.1)
Sodium: 141 mmol/L (ref 135–145)
Total Bilirubin: 0.6 mg/dL (ref 0.3–1.2)
Total Protein: 6.2 g/dL — ABNORMAL LOW (ref 6.5–8.1)

## 2022-06-13 LAB — CBC
HCT: 38 % (ref 36.0–46.0)
Hemoglobin: 11.6 g/dL — ABNORMAL LOW (ref 12.0–15.0)
MCH: 25.6 pg — ABNORMAL LOW (ref 26.0–34.0)
MCHC: 30.5 g/dL (ref 30.0–36.0)
MCV: 83.9 fL (ref 80.0–100.0)
Platelets: 179 10*3/uL (ref 150–400)
RBC: 4.53 MIL/uL (ref 3.87–5.11)
RDW: 16.1 % — ABNORMAL HIGH (ref 11.5–15.5)
WBC: 4.8 10*3/uL (ref 4.0–10.5)
nRBC: 0 % (ref 0.0–0.2)

## 2022-06-13 SURGERY — CHOLECYSTECTOMY, ROBOT-ASSISTED, LAPAROSCOPIC
Anesthesia: General | Site: Abdomen

## 2022-06-13 MED ORDER — SUCCINYLCHOLINE CHLORIDE 200 MG/10ML IV SOSY
PREFILLED_SYRINGE | INTRAVENOUS | Status: DC | PRN
  Administered 2022-06-13: 100 mg via INTRAVENOUS

## 2022-06-13 MED ORDER — MIDAZOLAM HCL 2 MG/2ML IJ SOLN
INTRAMUSCULAR | Status: AC
  Filled 2022-06-13: qty 2

## 2022-06-13 MED ORDER — PROPOFOL 10 MG/ML IV BOLUS
INTRAVENOUS | Status: DC | PRN
  Administered 2022-06-13: 200 mg via INTRAVENOUS

## 2022-06-13 MED ORDER — OXYCODONE HCL 5 MG PO TABS: 5.0000 mg | ORAL_TABLET | Freq: Once | ORAL | Status: DC | PRN

## 2022-06-13 MED ORDER — LIDOCAINE HCL (CARDIAC) PF 100 MG/5ML IV SOSY
PREFILLED_SYRINGE | INTRAVENOUS | Status: DC | PRN
  Administered 2022-06-13: 100 mg via INTRAVENOUS

## 2022-06-13 MED ORDER — APREPITANT 40 MG PO CAPS
40.0000 mg | ORAL_CAPSULE | Freq: Once | ORAL | Status: AC
  Administered 2022-06-13: 40 mg via ORAL

## 2022-06-13 MED ORDER — SUGAMMADEX SODIUM 500 MG/5ML IV SOLN
INTRAVENOUS | Status: DC | PRN
  Administered 2022-06-13: 400 mg via INTRAVENOUS

## 2022-06-13 MED ORDER — LACTATED RINGERS IV SOLN: INTRAVENOUS | Status: DC | PRN

## 2022-06-13 MED ORDER — BUPIVACAINE LIPOSOME 1.3 % IJ SUSP
INTRAMUSCULAR | Status: AC
  Filled 2022-06-13: qty 20

## 2022-06-13 MED ORDER — ACETAMINOPHEN 10 MG/ML IV SOLN: 1000.0000 mg | Freq: Once | INTRAVENOUS | Status: DC | PRN

## 2022-06-13 MED ORDER — BUPIVACAINE-EPINEPHRINE (PF) 0.25% -1:200000 IJ SOLN
INTRAMUSCULAR | Status: DC | PRN
  Administered 2022-06-13: 50 mL via INTRAMUSCULAR

## 2022-06-13 MED ORDER — ONDANSETRON HCL 4 MG/2ML IJ SOLN: 4.0000 mg | Freq: Once | INTRAMUSCULAR | Status: DC | PRN

## 2022-06-13 MED ORDER — ONDANSETRON HCL 4 MG/2ML IJ SOLN
INTRAMUSCULAR | Status: DC | PRN
  Administered 2022-06-13: 4 mg via INTRAVENOUS

## 2022-06-13 MED ORDER — GABAPENTIN 300 MG PO CAPS
ORAL_CAPSULE | ORAL | Status: AC
  Filled 2022-06-13: qty 1

## 2022-06-13 MED ORDER — FENTANYL CITRATE (PF) 100 MCG/2ML IJ SOLN
25.0000 ug | INTRAMUSCULAR | Status: DC | PRN
  Administered 2022-06-13 (×3): 25 ug via INTRAVENOUS

## 2022-06-13 MED ORDER — DROPERIDOL 2.5 MG/ML IJ SOLN
0.6250 mg | Freq: Once | INTRAMUSCULAR | Status: AC
Start: 2022-06-13 — End: 2022-06-13
  Administered 2022-06-13: 0.625 mg via INTRAVENOUS

## 2022-06-13 MED ORDER — BUPIVACAINE-EPINEPHRINE (PF) 0.25% -1:200000 IJ SOLN
INTRAMUSCULAR | Status: AC
Start: 2022-06-13 — End: ?
  Filled 2022-06-13: qty 30

## 2022-06-13 MED ORDER — LACTATED RINGERS IV SOLN: Freq: Once | INTRAVENOUS | Status: AC

## 2022-06-13 MED ORDER — DIPHENHYDRAMINE HCL 50 MG/ML IJ SOLN
INTRAMUSCULAR | Status: DC | PRN
  Administered 2022-06-13: 12.5 mg via INTRAVENOUS

## 2022-06-13 MED ORDER — DEXMEDETOMIDINE HCL IN NACL 200 MCG/50ML IV SOLN
INTRAVENOUS | Status: DC | PRN
  Administered 2022-06-13: 4 ug via INTRAVENOUS
  Administered 2022-06-13: 12 ug via INTRAVENOUS
  Administered 2022-06-13: 16 ug via INTRAVENOUS

## 2022-06-13 MED ORDER — APREPITANT 40 MG PO CAPS
ORAL_CAPSULE | ORAL | Status: AC
  Filled 2022-06-13: qty 1

## 2022-06-13 MED ORDER — MIDAZOLAM HCL 2 MG/2ML IJ SOLN
INTRAMUSCULAR | Status: DC | PRN
  Administered 2022-06-13: 2 mg via INTRAVENOUS

## 2022-06-13 MED ORDER — FENTANYL CITRATE (PF) 100 MCG/2ML IJ SOLN
INTRAMUSCULAR | Status: AC
  Filled 2022-06-13: qty 2

## 2022-06-13 MED ORDER — ROCURONIUM BROMIDE 100 MG/10ML IV SOLN
INTRAVENOUS | Status: DC | PRN
  Administered 2022-06-13: 50 mg via INTRAVENOUS

## 2022-06-13 MED ORDER — FENTANYL CITRATE (PF) 100 MCG/2ML IJ SOLN
INTRAMUSCULAR | Status: DC | PRN
  Administered 2022-06-13 (×2): 50 ug via INTRAVENOUS

## 2022-06-13 MED ORDER — CEFAZOLIN SODIUM-DEXTROSE 2-4 GM/100ML-% IV SOLN
INTRAVENOUS | Status: AC
  Filled 2022-06-13: qty 100

## 2022-06-13 MED ORDER — INDOCYANINE GREEN 25 MG IV SOLR
2.5000 mg | Freq: Once | INTRAVENOUS | Status: AC
  Administered 2022-06-13: 2.5 mg via INTRAVENOUS
  Filled 2022-06-13: qty 1

## 2022-06-13 MED ORDER — HYDROCODONE-ACETAMINOPHEN 5-325 MG PO TABS: 1.0000 | ORAL_TABLET | ORAL | 0 refills | Status: DC | PRN

## 2022-06-13 MED ORDER — OXYCODONE HCL 5 MG/5ML PO SOLN: 5.0000 mg | Freq: Once | ORAL | Status: DC | PRN

## 2022-06-13 MED ORDER — CELECOXIB 200 MG PO CAPS
ORAL_CAPSULE | ORAL | Status: AC
  Filled 2022-06-13: qty 1

## 2022-06-13 MED ORDER — 0.9 % SODIUM CHLORIDE (POUR BTL) OPTIME
TOPICAL | Status: DC | PRN
  Administered 2022-06-13: 500 mL

## 2022-06-13 MED ORDER — DEXAMETHASONE SODIUM PHOSPHATE 10 MG/ML IJ SOLN
INTRAMUSCULAR | Status: DC | PRN
  Administered 2022-06-13: 10 mg via INTRAVENOUS

## 2022-06-13 MED ORDER — DROPERIDOL 2.5 MG/ML IJ SOLN
INTRAMUSCULAR | Status: AC
  Filled 2022-06-13: qty 2

## 2022-06-13 MED ORDER — GLYCOPYRROLATE 0.2 MG/ML IJ SOLN
INTRAMUSCULAR | Status: DC | PRN
  Administered 2022-06-13: .2 mg via INTRAVENOUS

## 2022-06-13 MED ORDER — FENTANYL CITRATE (PF) 100 MCG/2ML IJ SOLN
INTRAMUSCULAR | Status: AC
  Administered 2022-06-13: 25 ug via INTRAVENOUS
  Filled 2022-06-13: qty 2

## 2022-06-13 MED ORDER — LACTATED RINGERS IV SOLN: INTRAVENOUS | Status: DC

## 2022-06-13 SURGICAL SUPPLY — 48 items
CANNULA REDUC XI 12-8 STAPL (CANNULA) ×1
CANNULA REDUCER 12-8 DVNC XI (CANNULA) ×2 IMPLANT
CATH REDDICK CHOLANGI 4FR 50CM (CATHETERS) IMPLANT
CLIP LIGATING HEMO O LOK GREEN (MISCELLANEOUS) ×3 IMPLANT
DERMABOND ADVANCED (GAUZE/BANDAGES/DRESSINGS) ×1
DERMABOND ADVANCED .7 DNX12 (GAUZE/BANDAGES/DRESSINGS) ×2 IMPLANT
DRAPE ARM DVNC X/XI (DISPOSABLE) ×8 IMPLANT
DRAPE COLUMN DVNC XI (DISPOSABLE) ×2 IMPLANT
DRAPE DA VINCI XI ARM (DISPOSABLE) ×4
DRAPE DA VINCI XI COLUMN (DISPOSABLE) ×1
ELECT CAUTERY BLADE 6.4 (BLADE) ×3 IMPLANT
ELECT REM PT RETURN 9FT ADLT (ELECTROSURGICAL) ×3
ELECTRODE REM PT RTRN 9FT ADLT (ELECTROSURGICAL) ×2 IMPLANT
GLOVE BIO SURGEON STRL SZ7 (GLOVE) ×6 IMPLANT
GOWN STRL REUS W/ TWL LRG LVL3 (GOWN DISPOSABLE) ×8 IMPLANT
GOWN STRL REUS W/TWL LRG LVL3 (GOWN DISPOSABLE) ×4
IRRIGATION STRYKERFLOW (MISCELLANEOUS) IMPLANT
IRRIGATOR STRYKERFLOW (MISCELLANEOUS)
IV CATH ANGIO 12GX3 LT BLUE (NEEDLE) IMPLANT
KIT PINK PAD W/HEAD ARE REST (MISCELLANEOUS) ×3 IMPLANT
KIT PINK PAD W/HEAD ARM REST (MISCELLANEOUS) ×2 IMPLANT
LABEL OR SOLS (LABEL) ×3 IMPLANT
MANIFOLD NEPTUNE II (INSTRUMENTS) ×3 IMPLANT
NEEDLE HYPO 22GX1.5 SAFETY (NEEDLE) ×3 IMPLANT
NS IRRIG 500ML POUR BTL (IV SOLUTION) ×3 IMPLANT
OBTURATOR OPTICAL STANDARD 8MM (TROCAR) ×1
OBTURATOR OPTICAL STND 8 DVNC (TROCAR) ×2
OBTURATOR OPTICALSTD 8 DVNC (TROCAR) ×2 IMPLANT
PACK LAP CHOLECYSTECTOMY (MISCELLANEOUS) ×3 IMPLANT
SEAL CANN UNIV 5-8 DVNC XI (MISCELLANEOUS) ×6 IMPLANT
SEAL XI 5MM-8MM UNIVERSAL (MISCELLANEOUS) ×3
SET TUBE SMOKE EVAC HIGH FLOW (TUBING) ×3 IMPLANT
SOLUTION ELECTROLUBE (MISCELLANEOUS) ×3 IMPLANT
SPIKE FLUID TRANSFER (MISCELLANEOUS) ×2 IMPLANT
SPONGE T-LAP 18X18 ~~LOC~~+RFID (SPONGE) ×3 IMPLANT
SPONGE T-LAP 4X18 ~~LOC~~+RFID (SPONGE) IMPLANT
STAPLER CANNULA SEAL DVNC XI (STAPLE) ×2 IMPLANT
STAPLER CANNULA SEAL XI (STAPLE) ×1
STOPCOCK 3 WAY MALE LL (IV SETS)
STOPCOCK 3WAY MALE LL (IV SETS) IMPLANT
SUT MNCRL AB 4-0 PS2 18 (SUTURE) ×4 IMPLANT
SUT VICRYL 0 AB UR-6 (SUTURE) ×6 IMPLANT
SYR 20ML LL LF (SYRINGE) ×3 IMPLANT
SYR 30ML LL (SYRINGE) ×3 IMPLANT
SYS BAG RETRIEVAL 10MM (BASKET) ×3
SYSTEM BAG RETRIEVAL 10MM (BASKET) ×2 IMPLANT
WATER STERILE IRR 3000ML UROMA (IV SOLUTION) IMPLANT
WATER STERILE IRR 500ML POUR (IV SOLUTION) ×2 IMPLANT

## 2022-06-13 NOTE — Anesthesia Preprocedure Evaluation (Signed)
Anesthesia Evaluation  Patient identified by MRN, date of birth, ID band Patient awake    Reviewed: Allergy & Precautions, NPO status , Patient's Chart, lab work & pertinent test results  History of Anesthesia Complications Negative for: history of anesthetic complications  Airway Mallampati: III  TM Distance: >3 FB Neck ROM: full    Dental  (+) Chipped   Pulmonary neg pulmonary ROS,    Pulmonary exam normal        Cardiovascular (-) angina(-) CAD and (-) Past MI negative cardio ROS Normal cardiovascular exam     Neuro/Psych PSYCHIATRIC DISORDERS negative neurological ROS     GI/Hepatic negative GI ROS, Neg liver ROS,   Endo/Other  negative endocrine ROS  Renal/GU      Musculoskeletal   Abdominal   Peds  Hematology negative hematology ROS (+)   Anesthesia Other Findings PMH of RBBB.   Past Medical History: No date: EKG abnormalities No date: PCOS (polycystic ovarian syndrome) 2017: Right bundle branch block No date: Scoliosis  Past Surgical History: 04/08/2022: CESAREAN SECTION     Comment:  Procedure: CESAREAN SECTION;  Surgeon: Hildred Laser,               MD;  Location: [facility] ORS;  Service: Obstetrics;; No date: ESOPHAGUS SURGERY     Comment:  foreign object removed  BMI    Body Mass Index: 33.87 kg/m      Reproductive/Obstetrics negative OB ROS                             Anesthesia Physical Anesthesia Plan  ASA: 2  Anesthesia Plan: General/Spinal   Post-op Pain Management:    Induction: Intravenous  PONV Risk Score and Plan: 4 or greater and Ondansetron, Dexamethasone, Midazolam and Treatment may vary due to age or medical condition  Airway Management Planned: Oral ETT  Additional Equipment:   Intra-op Plan:   Post-operative Plan: Extubation in OR  Informed Consent: I have reviewed the patients History and Physical, chart, labs and discussed the procedure  including the risks, benefits and alternatives for the proposed anesthesia with the patient or authorized representative who has indicated his/her understanding and acceptance.     Dental Advisory Given  Plan Discussed with: Anesthesiologist, CRNA and Surgeon  Anesthesia Plan Comments: (Patient consented for risks of anesthesia including but not limited to:  - adverse reactions to medications - damage to eyes, teeth, lips or other oral mucosa - nerve damage due to positioning  - sore throat or hoarseness - Damage to heart, brain, nerves, lungs, other parts of body or loss of life  Patient voiced understanding.)        Anesthesia Quick Evaluation

## 2022-06-13 NOTE — Anesthesia Procedure Notes (Addendum)
Procedure Name: General with mask airway Date/Time: 06/13/2022 2:16 PM  Performed by: Mohammed Kindle, CRNAPre-anesthesia Checklist: Patient identified, Emergency Drugs available, Suction available and Patient being monitored Patient Re-evaluated:Patient Re-evaluated prior to induction Oxygen Delivery Method: Simple face mask Preoxygenation: Pre-oxygenation with 100% oxygen Induction Type: IV induction, Rapid sequence and Cricoid Pressure applied Laryngoscope Size: McGraph and 3 Grade View: Grade I Tube size: 6.5 mm Number of attempts: 1 Airway Equipment and Method: Stylet Placement Confirmation: ETT inserted through vocal cords under direct vision, positive ETCO2, CO2 detector and breath sounds checked- equal and bilateral Secured at: 21 cm Tube secured with: Tape Dental Injury: Teeth and Oropharynx as per pre-operative assessment

## 2022-06-13 NOTE — TOC Initial Note (Addendum)
Transition of Care [facility]) - Initial/Assessment Note    Patient Details  Name: Holly Wu MRN: [id number] Date of Birth: 01-Jul-1996  Transition of Care [facility]) CM/SW Contact:    Chapman Fitch, RN Phone Number: 06/13/2022, 1:19 PM  Clinical Narrative:                   Transition of Care [facility]) Screening Note   Patient Details  Name: Holly Wu Date of Birth: 06/03/1996   Transition of Care [facility]) CM/SW Contact:    Chapman Fitch, RN Phone Number: 06/13/2022, 1:19 PM    Transition of Care Department [facility]) has reviewed patient and no TOC needs have been identified at this time. We will continue to monitor patient advancement through interdisciplinary progression rounds. If new patient transition needs arise, please place a TOC consult.       Patient Goals and CMS Choice        Expected Discharge Plan and Services                                                Prior Living Arrangements/Services                       Activities of Daily Living Home Assistive Devices/Equipment: None ADL Screening (condition at time of admission) Patient's cognitive ability adequate to safely complete daily activities?: Yes Is the patient deaf or have difficulty hearing?: No Does the patient have difficulty seeing, even when wearing glasses/contacts?: No Does the patient have difficulty concentrating, remembering, or making decisions?: No Patient able to express need for assistance with ADLs?: Yes Does the patient have difficulty dressing or bathing?: No Independently performs ADLs?: Yes (appropriate for developmental age) Does the patient have difficulty walking or climbing stairs?: No Weakness of Legs: None Weakness of Arms/Hands: None  Permission Sought/Granted                  Emotional Assessment              Admission diagnosis:  Acute cholecystitis [K81.0] Patient Active Problem List   Diagnosis Date Noted    Acute cholecystitis 06/12/2022   Secondary oligomenorrhea 07/13/2018   Polycystic ovary syndrome 07/13/2018   History of right bundle branch block 2017   PCP:  Erick Colace, MD Pharmacy:   [phone] - Closed - [place], [place] - [address] [place] [address] Phone: [phone] Fax: [phone]  [id number] [place], [place] - [address] AT [facility] [address] [place] [address] [place] [address] Phone: [phone] Fax: [phone]     Social Determinants of Health (SDOH) Interventions    Readmission Risk Interventions     No data to display

## 2022-06-13 NOTE — Progress Notes (Signed)
Patient alert and oriented. No acute distress noted. Significant other at bedside. Patient discharged to home without incident.

## 2022-06-13 NOTE — Transfer of Care (Signed)
Immediate Anesthesia Transfer of Care Note  Patient: Holly Wu  Procedure(s) Performed: XI ROBOTIC ASSISTED LAPAROSCOPIC CHOLECYSTECTOMY (Abdomen) INDOCYANINE GREEN FLUORESCENCE IMAGING (ICG)  Patient Location: PACU  Anesthesia Type:General  Level of Consciousness: awake, drowsy and patient cooperative  Airway & Oxygen Therapy: Patient Spontanous Breathing and Patient connected to face mask oxygen  Post-op Assessment: Report given to RN and Post -op Vital signs reviewed and stable  Post vital signs: Reviewed and stable  Last Vitals:  Vitals Value Taken Time  BP 123/87 06/13/22 1515  Temp    Pulse 95 06/13/22 1520  Resp 19 06/13/22 1520  SpO2 100 % 06/13/22 1520  Vitals shown include unvalidated device data.  Last Pain:  Vitals:   06/13/22 1323  TempSrc:   PainSc: 4       Patients Stated Pain Goal: 0 (06/13/22 1323)  Complications: No notable events documented.

## 2022-06-13 NOTE — Progress Notes (Signed)
For robotic cholecystectomy today.The risks, benefits, complications, treatment options, and expected outcomes were discussed with the patient. The possibilities of bleeding, recurrent infection, finding a normal gallbladder, perforation of viscus organs, damage to surrounding structures, bile leak, abscess formation, needing a drain placed, the need for additional procedures, reaction to medication, pulmonary aspiration,  failure to diagnose a condition, the possible need to convert to an open procedure, and creating a complication requiring transfusion or operation were discussed with the patient. The patient and/or family concurred with the proposed plan, giving informed consent.

## 2022-06-13 NOTE — Discharge Instructions (Signed)

## 2022-06-13 NOTE — Anesthesia Postprocedure Evaluation (Signed)
Anesthesia Post Note  Patient: Holly Wu  Procedure(s) Performed: XI ROBOTIC ASSISTED LAPAROSCOPIC CHOLECYSTECTOMY (Abdomen) INDOCYANINE GREEN FLUORESCENCE IMAGING (ICG)  Patient location during evaluation: PACU Anesthesia Type: General Level of consciousness: awake and alert, oriented and patient cooperative Pain management: pain level controlled Vital Signs Assessment: post-procedure vital signs reviewed and stable Respiratory status: spontaneous breathing, nonlabored ventilation and respiratory function stable Cardiovascular status: blood pressure returned to baseline and stable Postop Assessment: adequate PO intake Anesthetic complications: no Comments: PONV, ultimately controlled with droperidol in PACU.   No notable events documented.   Last Vitals:  Vitals:   06/13/22 1545 06/13/22 1600  BP: 120/70 121/76  Pulse: 90 83  Resp: (!) 24 17  Temp:    SpO2: 98% 99%    Last Pain:  Vitals:   06/13/22 1545  TempSrc:   PainSc: 8                  Reed Breech

## 2022-06-13 NOTE — Op Note (Signed)
Robotic assisted laparoscopic Cholecystectomy  Pre-operative Diagnosis: cholecystitis  Post-operative Diagnosis: same  Procedure:  Robotic assisted laparoscopic Cholecystectomy  Surgeon: Sterling Big, MD FACS  Anesthesia: Gen. with endotracheal tube  Findings:  Cholecystitis   Estimated Blood Loss:5 cc       Specimens: Gallbladder           Complications: none   Procedure Details  The patient was seen again in the Holding Room. The benefits, complications, treatment options, and expected outcomes were discussed with the patient. The risks of bleeding, infection, recurrence of symptoms, failure to resolve symptoms, bile duct damage, bile duct leak, retained common bile duct stone, bowel injury, any of which could require further surgery and/or ERCP, stent, or papillotomy were reviewed with the patient. The likelihood of improving the patient's symptoms with return to their baseline status is good.  The patient and/or family concurred with the proposed plan, giving informed consent.  The patient was taken to Operating Room, identified  and the procedure verified as Laparoscopic Cholecystectomy.  A Time Out was held and the above information confirmed.  Prior to the induction of general anesthesia, antibiotic prophylaxis was administered. VTE prophylaxis was in place. General endotracheal anesthesia was then administered and tolerated well. After the induction, the abdomen was prepped with Chloraprep and draped in the sterile fashion. The patient was positioned in the supine position.  Cut down technique was used to enter the abdominal cavity and a Hasson trochar was placed after two vicryl stitches were anchored to the fascia. Pneumoperitoneum was then created with CO2 and tolerated well without any adverse changes in the patient's vital signs.  Three 8-mm ports were placed under direct vision. All skin incisions  were infiltrated with a local anesthetic agent before making the incision and  placing the trocars.   The patient was positioned  in reverse Trendelenburg, robot was brought to the surgical field and docked in the standard fashion.  We made sure all the instrumentation was kept indirect view at all times and that there were no collision between the arms. I scrubbed out and went to the console.  The gallbladder was identified, the fundus grasped and retracted cephalad. Adhesions were lysed bluntly. The infundibulum was grasped and retracted laterally, exposing the peritoneum overlying the triangle of Calot. This was then divided and exposed in a blunt fashion. An extended critical view of the cystic duct and cystic artery was obtained.  The cystic duct was clearly identified and bluntly dissected.   Artery and duct were double clipped and divided. Using ICG cholangiography we visualize the cystic duct and CBD no evidence of bile injuries. The gallbladder was taken from the gallbladder fossa in a retrograde fashion with the electrocautery.  Hemostasis was achieved with the electrocautery. nspection of the right upper quadrant was performed. No bleeding, bile duct injury or leak, or bowel injury was noted. Robotic instruments and robotic arms were undocked in the standard fashion.  I scrubbed back in.  The gallbladder was removed and placed in an Endocatch bag.   Pneumoperitoneum was released.  The periumbilical port site was closed with interrumpted 0 Vicryl sutures. 4-0 subcuticular Monocryl was used to close the skin. Dermabond was  applied.  The patient was then extubated and brought to the recovery room in stable condition. Sponge, lap, and needle counts were correct at closure and at the conclusion of the case.               Sterling Big, MD, FACS

## 2022-06-14 NOTE — Discharge Summary (Signed)
  Patient ID: Holly Wu MRN: [id number] DOB/AGE: 1996/08/24 25 y.o.  Admit date: 06/12/2022 Discharge date: 06/13/2022   Discharge Diagnoses:  Principal Problem:   Acute cholecystitis   Procedures:Robotic cholecystectomy  Hospital Course:  26 yo admitted with findings consistent with acute cholecystitis  and  was taken promptly to the operating room for an uneventful laparoscopic cholecystectomy. She was observed after the surgery and at  The time of discharge the patient was ambulating,  pain was controlled.  Her vital signs were stable and she was afebrile.   physical exam at discharge showed a pt  in no acute distress.  Awake and alert.  Abdomen: Soft incisions healing well without infection or peritonitis.  Extremities well-perfused and no edema.  Condition of the patient the time of discharge was stable    Disposition: Discharge disposition: 01-Home or Self Care       Discharge Instructions     Call MD for:  difficulty breathing, headache or visual disturbances   Complete by: As directed    Call MD for:  extreme fatigue   Complete by: As directed    Call MD for:  hives   Complete by: As directed    Call MD for:  persistant dizziness or light-headedness   Complete by: As directed    Call MD for:  persistant nausea and vomiting   Complete by: As directed    Call MD for:  redness, tenderness, or signs of infection (pain, swelling, redness, odor or green/yellow discharge around incision site)   Complete by: As directed    Call MD for:  severe uncontrolled pain   Complete by: As directed    Call MD for:  temperature >100.4   Complete by: As directed    Diet - low sodium heart healthy   Complete by: As directed    Discharge instructions   Complete by: As directed    Shower 48 hrs   Increase activity slowly   Complete by: As directed    Lifting restrictions   Complete by: As directed    20 lbs x 4-6 weeks      Allergies as of 06/13/2022        Reactions   Toradol [ketorolac Tromethamine] Rash   Zofran [ondansetron] Rash        Medication List     TAKE these medications    acetaminophen 500 MG tablet Commonly known as: TYLENOL Take 2 tablets (1,000 mg total) by mouth every 6 (six) hours.   ferrous sulfate 325 (65 FE) MG tablet Take 1 tablet (325 mg total) by mouth every other day.   HYDROcodone-acetaminophen 5-325 MG tablet Commonly known as: NORCO/VICODIN Take 1-2 tablets by mouth every 4 (four) hours as needed for moderate pain.   ibuprofen 600 MG tablet Commonly known as: ADVIL Take 1 tablet (600 mg total) by mouth every 6 (six) hours.   medroxyPROGESTERone 150 MG/ML injection Commonly known as: DEPO-PROVERA Inject 1 mL (150 mg total) into the muscle once for 1 dose. Start taking on: July 04, 2022        Follow-up Information     Donovan Kail, PA-C Follow up on 07/09/2022.   Specialty: Physician Assistant Contact information: [address] [place] [address] [phone]                  Sterling Big, MD FACS

## 2022-06-17 LAB — SURGICAL PATHOLOGY

## 2022-06-18 ENCOUNTER — Ambulatory Visit
Admission: RE | Admit: 2022-06-18 | Payer: Federal, State, Local not specified - PPO | Source: Home / Self Care | Admitting: Surgery

## 2022-06-18 ENCOUNTER — Encounter: Admission: RE | Payer: Self-pay | Source: Home / Self Care

## 2022-06-18 SURGERY — CHOLECYSTECTOMY, ROBOT-ASSISTED, LAPAROSCOPIC
Anesthesia: General

## 2022-07-12 ENCOUNTER — Telehealth: Payer: Federal, State, Local not specified - PPO | Admitting: Physician Assistant

## 2022-07-12 DIAGNOSIS — R3989 Other symptoms and signs involving the genitourinary system: Secondary | ICD-10-CM

## 2022-07-12 MED ORDER — CEPHALEXIN 500 MG PO CAPS: 500.0000 mg | ORAL_CAPSULE | Freq: Two times a day (BID) | ORAL | 0 refills | Status: DC

## 2022-07-12 NOTE — Progress Notes (Signed)

## 2022-11-25 ENCOUNTER — Telehealth: Payer: Federal, State, Local not specified - PPO | Admitting: Physician Assistant

## 2022-11-25 DIAGNOSIS — R3989 Other symptoms and signs involving the genitourinary system: Secondary | ICD-10-CM | POA: Diagnosis not present

## 2022-11-25 MED ORDER — CEPHALEXIN 500 MG PO CAPS: 500.0000 mg | ORAL_CAPSULE | Freq: Two times a day (BID) | ORAL | 0 refills | Status: DC

## 2022-11-25 NOTE — Progress Notes (Signed)

## 2022-11-27 DIAGNOSIS — R1032 Left lower quadrant pain: Secondary | ICD-10-CM | POA: Diagnosis not present

## 2022-11-27 DIAGNOSIS — R300 Dysuria: Secondary | ICD-10-CM | POA: Diagnosis not present

## 2022-11-27 DIAGNOSIS — N12 Tubulo-interstitial nephritis, not specified as acute or chronic: Secondary | ICD-10-CM | POA: Diagnosis not present

## 2022-11-27 DIAGNOSIS — R112 Nausea with vomiting, unspecified: Secondary | ICD-10-CM | POA: Diagnosis not present

## 2022-11-27 DIAGNOSIS — R509 Fever, unspecified: Secondary | ICD-10-CM | POA: Diagnosis not present

## 2023-02-28 DIAGNOSIS — Z0389 Encounter for observation for other suspected diseases and conditions ruled out: Secondary | ICD-10-CM | POA: Diagnosis not present

## 2023-04-27 ENCOUNTER — Telehealth: Payer: Federal, State, Local not specified - PPO | Admitting: Physician Assistant

## 2023-04-27 DIAGNOSIS — Z3009 Encounter for other general counseling and advice on contraception: Secondary | ICD-10-CM

## 2023-04-28 ENCOUNTER — Encounter: Payer: Self-pay | Admitting: Obstetrics and Gynecology

## 2023-04-28 NOTE — Progress Notes (Signed)
Because you are requesting birth control, I feel your condition warrants further evaluation and I recommend that you be seen for a face to face visit.  Please contact your primary care physician practice to be seen. Many offices offer virtual options to be seen via video if you are not comfortable going in person to a medical facility at this time.  NOTE: You will NOT be charged for this eVisit.  If you do not have a PCP, [facility] offers a free physician referral service available at [phone]. Our trained staff has the experience, knowledge and resources to put you in touch with a physician who is right for you.    If you are having a true medical emergency please call 911.   Your e-visit answers were reviewed by a board certified advanced clinical practitioner to complete your personal care plan.  Thank you for using e-Visits.

## 2023-04-30 ENCOUNTER — Other Ambulatory Visit: Payer: Self-pay | Admitting: Obstetrics and Gynecology

## 2023-04-30 MED ORDER — NORELGESTROMIN-ETH ESTRADIOL 150-35 MCG/24HR TD PTWK: 1.0000 | MEDICATED_PATCH | TRANSDERMAL | 0 refills | Status: DC

## 2023-05-16 ENCOUNTER — Telehealth: Payer: Federal, State, Local not specified - PPO | Admitting: Physician Assistant

## 2023-05-16 DIAGNOSIS — L989 Disorder of the skin and subcutaneous tissue, unspecified: Secondary | ICD-10-CM

## 2023-05-16 DIAGNOSIS — R58 Hemorrhage, not elsewhere classified: Secondary | ICD-10-CM

## 2023-05-16 NOTE — Progress Notes (Signed)
   Thank you for the details you included in the comment boxes. Those details are very helpful in determining the best course of treatment for you and help [place] to provide the best care.Because we cannot fully assess this via an e-visit, we recommend that you convert this visit to a video visit in order for the provider to better assess what is going on.  The provider will be able to give you a more accurate diagnosis and treatment plan if we can more freely discuss your symptoms and with the addition of a virtual examination.   If you convert to a video visit, we will bill your insurance (similar to an office visit) and you will not be charged for this e-Visit. You will be able to stay at home and speak with the first available [facility] advanced practice provider. The link to do a video visit is in the drop down Menu tab of your Welcome screen in MyChart.  If bleeding has still not let up any between time you submitted e-visit and now, we recommend an in-person evaluation if you are not able to do a video visit first thing this morning.

## 2023-06-16 ENCOUNTER — Other Ambulatory Visit: Payer: Self-pay | Admitting: Obstetrics and Gynecology

## 2023-07-19 ENCOUNTER — Telehealth: Payer: Medicaid Other | Admitting: Nurse Practitioner

## 2023-07-19 DIAGNOSIS — L239 Allergic contact dermatitis, unspecified cause: Secondary | ICD-10-CM | POA: Diagnosis not present

## 2023-07-19 MED ORDER — HYDROCORTISONE 1 % EX OINT: 1.0000 | TOPICAL_OINTMENT | Freq: Two times a day (BID) | CUTANEOUS | 0 refills | Status: DC

## 2023-07-19 NOTE — Progress Notes (Signed)
I have spent 5 minutes in review of e-visit questionnaire, review and updating patient chart, medical decision making and response to patient.  ° °Zelda W Fleming, NP ° °  °

## 2023-07-19 NOTE — Progress Notes (Signed)
E Visit for Rash  We are sorry that you are not feeling well. Here is how we plan to help!  Based on what you shared with me it looks like you have contact dermatitis.  Contact dermatitis is a skin rash caused by something that touches the skin and causes irritation or inflammation.  Your skin may be red, swollen, dry, cracked, and itch.  The rash should go away in a few days but can last a few weeks.  If you get a rash, it's important to figure out what caused it so the irritant can be avoided in the future.  You can take benadryl over the counter for the itching and  I have sent prescription hydrocortisone to the pharmacy   HOME CARE:  Take cool showers and avoid direct sunlight. Apply cool compress or wet dressings. Take a bath in an oatmeal bath.  Sprinkle content of one Aveeno packet under running faucet with comfortably warm water.  Bathe for 15-20 minutes, 1-2 times daily.  Pat dry with a towel. Do not rub the rash. Use hydrocortisone cream. Take an antihistamine like Benadryl for widespread rashes that itch.  The adult dose of Benadryl is 25-50 mg by mouth 4 times daily. Caution:  This type of medication may cause sleepiness.  Do not drink alcohol, drive, or operate dangerous machinery while taking antihistamines.  Do not take these medications if you have prostate enlargement.  Read package instructions thoroughly on all medications that you take.  GET HELP RIGHT AWAY IF:  Symptoms don't go away after treatment. Severe itching that persists. If you rash spreads or swells. If you rash begins to smell. If it blisters and opens or develops a yellow-brown crust. You develop a fever. You have a sore throat. You become short of breath.  MAKE SURE YOU:  Understand these instructions. Will watch your condition. Will get help right away if you are not doing well or get worse.  Thank you for choosing an e-visit.  Your e-visit answers were reviewed by a board certified advanced  clinical practitioner to complete your personal care plan. Depending upon the condition, your plan could have included both over the counter or prescription medications.  Please review your pharmacy choice. Make sure the pharmacy is open so you can pick up prescription now. If there is a problem, you may contact your provider through [employer] and have the prescription routed to another pharmacy.  Your safety is important to [place]. If you have drug allergies check your prescription carefully.   For the next 24 hours you can use MyChart to ask questions about today's visit, request a non-urgent call back, or ask for a work or school excuse. You will get an email in the next two days asking about your experience. I hope that your e-visit has been valuable and will speed your recovery.

## 2023-07-21 ENCOUNTER — Other Ambulatory Visit: Payer: Self-pay | Admitting: Obstetrics and Gynecology

## 2023-08-25 IMAGING — US US OB COMP +14 WK
1 series · 15 of 28 positions shown · non-contrast
Comparison: none

CLINICAL DATA: Unknown LMP. Second trimester pregnancy for fetal
anatomy survey.

EXAM:
OBSTETRICAL ULTRASOUND >14 WKS

[Series 1: us ob comp + 14 wk · 15 of 80 slices shown]
[im 1/80]
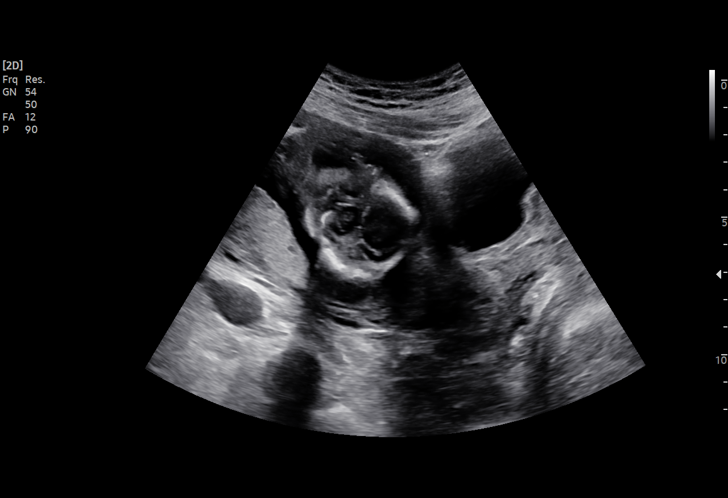
[im 6/80]
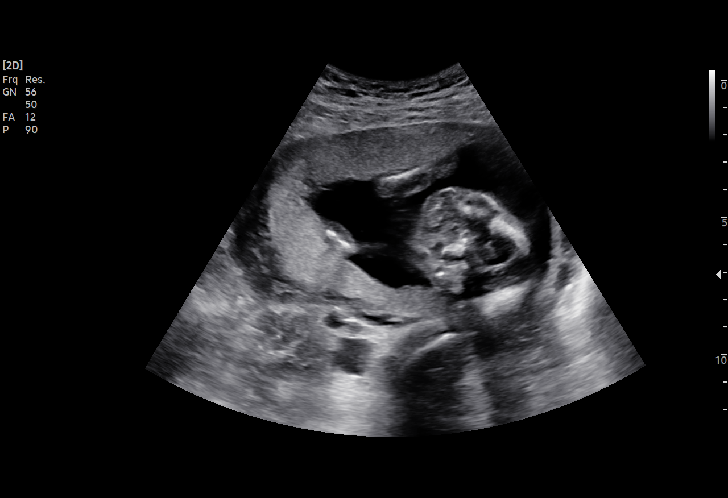
[im 12/80]
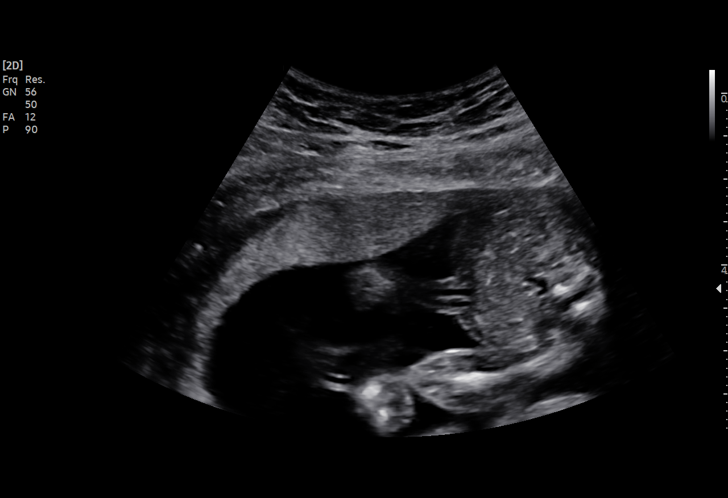
[im 18/80]
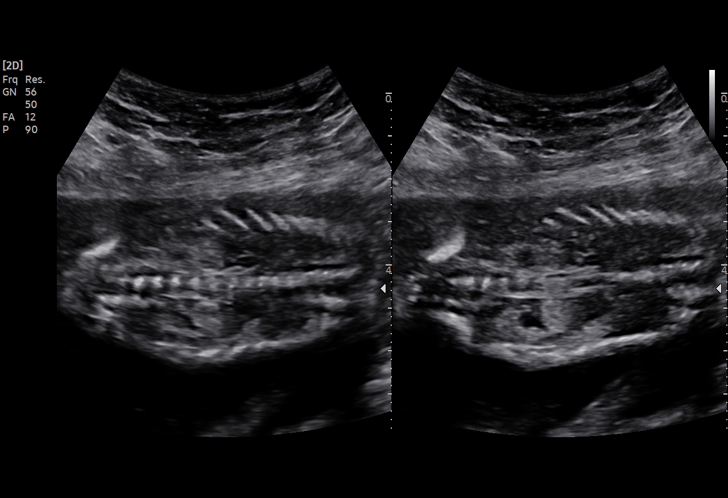
[im 24/80]
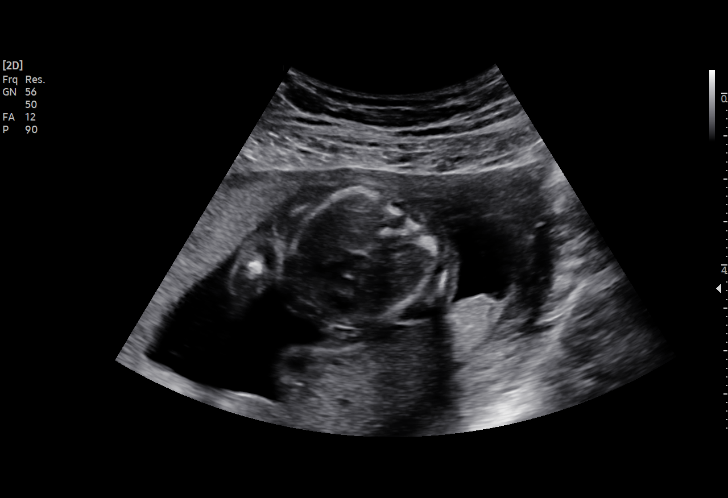
[im 30/80]
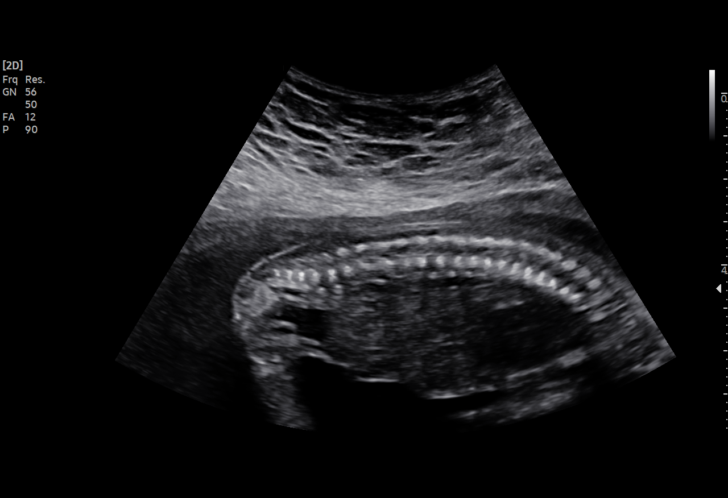
[im 36/80]
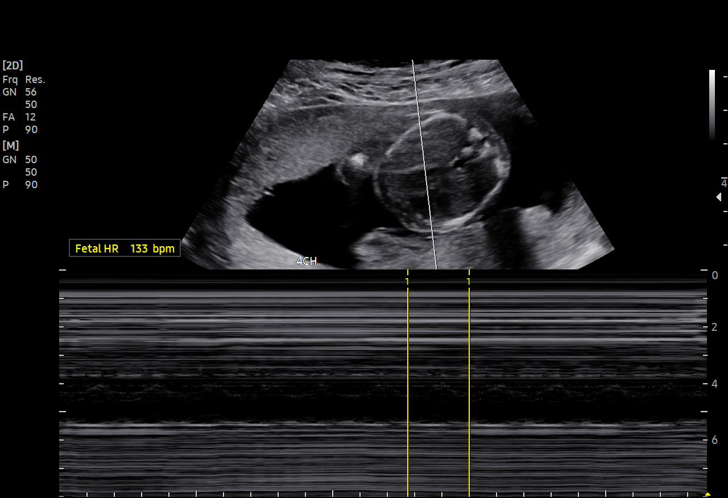
[im 41/80]
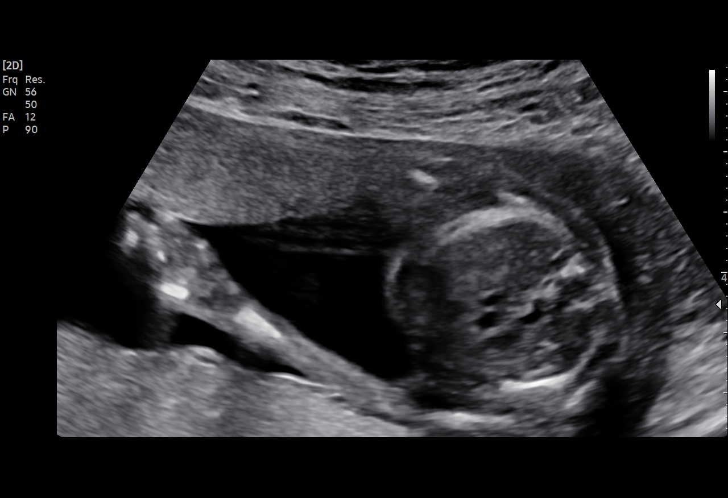
[im 44/80]
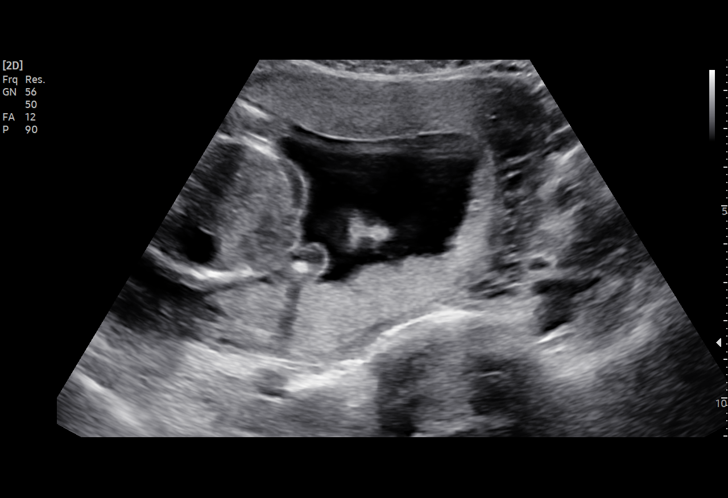
[im 50/80]
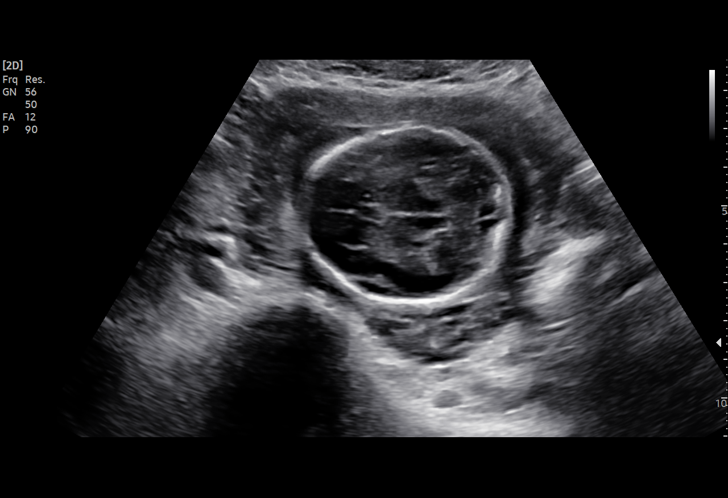
[im 56/80]
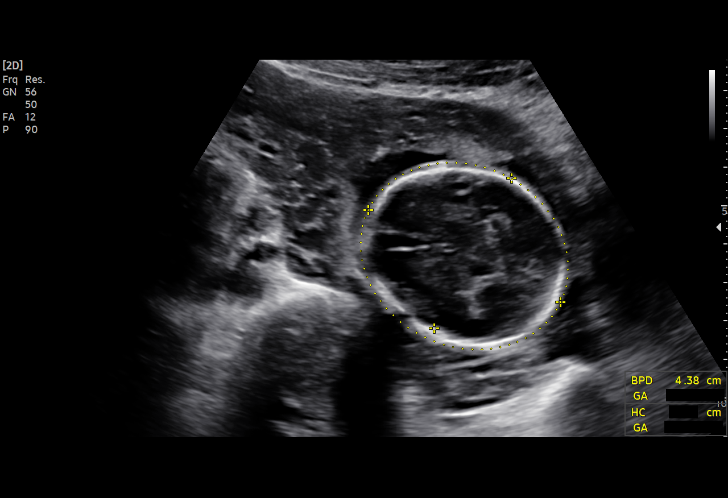
[im 62/80]
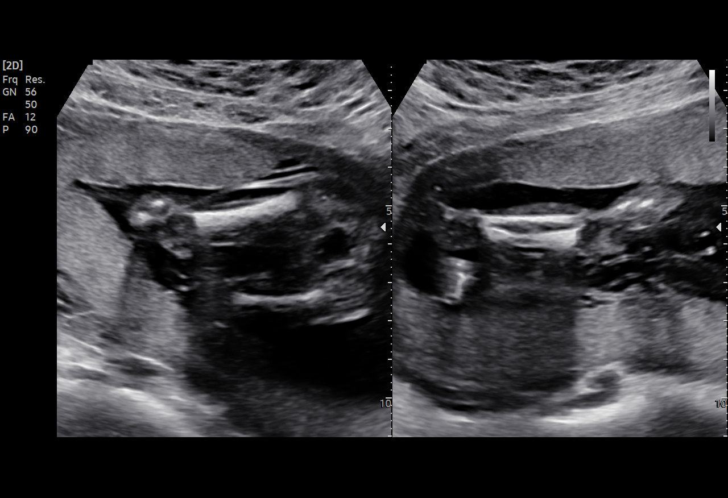
[im 68/80]
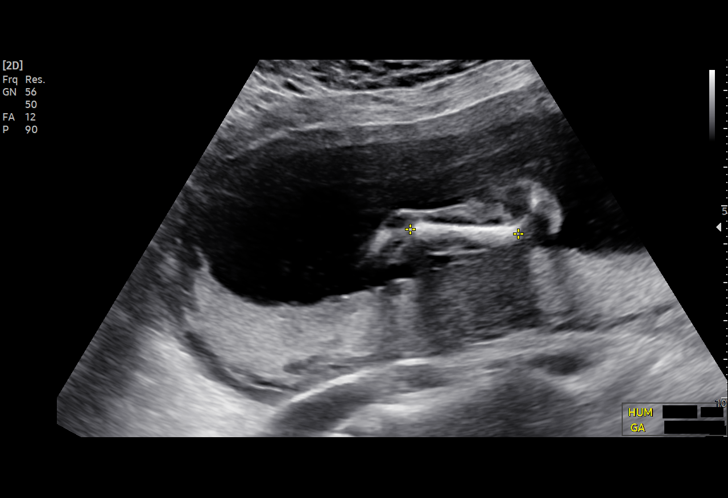
[im 74/80]
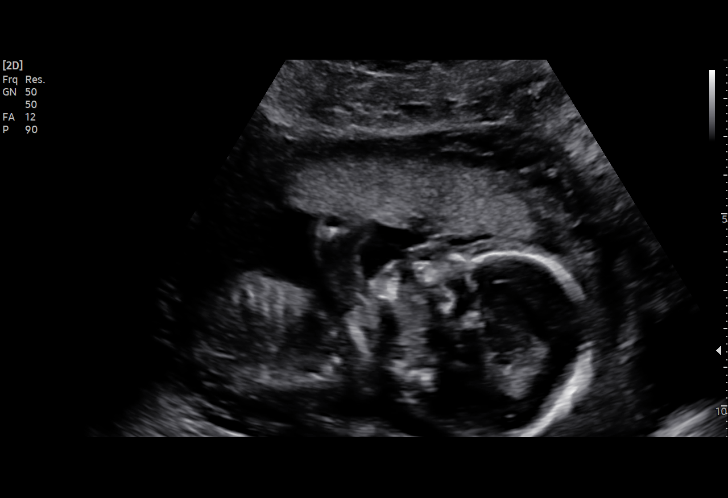
[im 80/80]
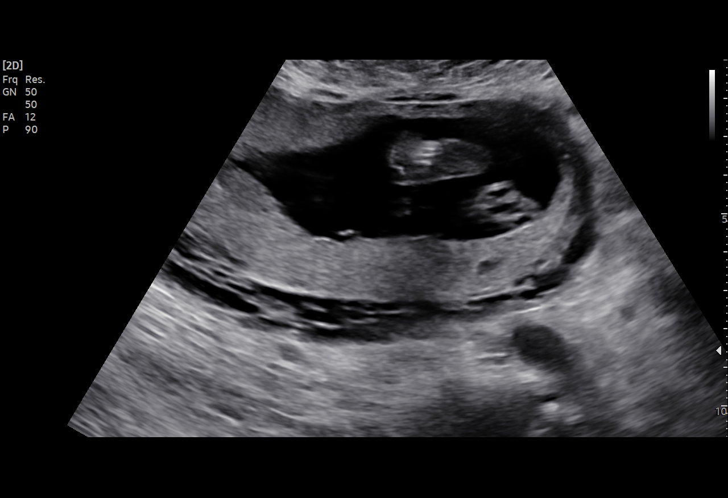

[15 of 28 positions shown; findings below may reference images not displayed]

FINDINGS: Number of Fetuses: 1

Heart Rate:  133 bpm

Movement: Yes

Presentation: Cephalic

Previa: No

Placental Location: Lateral Right

Amniotic Fluid (Subjective): Within normal limits

Amniotic Fluid (Objective):

Vertical pocket = 3.8cm

FETAL BIOMETRY

BPD: 4.4cm 19w 2d

HC:   16.1cm 19w 0d

AC:   13.7cm 19w 1d

FL:   3.1cm 19w 3d

Current Mean GA: 19w 1d US EDC: 04/01/2022

FETAL ANATOMY

Lateral Ventricles: Appears normal

Thalami/CSP: Appears normal

Posterior Fossa:  Appears normal

Nuchal Region: Appears normal   NFT= 3.1 mm

Upper Lip: Appears normal

Spine: Appears normal

4 Chamber Heart on Left: Appears normal

LVOT: Appears normal

RVOT: Appears normal

Stomach on Left: Appears normal

3 Vessel Cord: Appears normal

Cord Insertion site: Appears normal

Kidneys: Appears normal

Bladder: Appears normal

Extremities: Appears normal

Maternal Findings:

Cervix:  3.6 cm TA
IMPRESSION: Single living IUP with estimated gestational age of 19 weeks 1 day,
and US EDC of 04/01/2022.

Unremarkable fetal anatomic survey.  No fetal anomalies identified.

## 2023-09-17 ENCOUNTER — Telehealth: Payer: Medicaid Other | Admitting: Physician Assistant

## 2023-09-17 DIAGNOSIS — A084 Viral intestinal infection, unspecified: Secondary | ICD-10-CM | POA: Diagnosis not present

## 2023-09-17 MED ORDER — PROMETHAZINE HCL 25 MG PO TABS: 25.0000 mg | ORAL_TABLET | Freq: Two times a day (BID) | ORAL | 0 refills | Status: DC | PRN

## 2023-09-17 NOTE — Progress Notes (Signed)
E-Visit for Vomiting  We are sorry that you are not feeling well. Here is how we plan to help!  Based on what you have shared with me it looks like you have a Virus that is irritating your GI tract.  Vomiting is the forceful emptying of a portion of the stomach's content through the mouth.  Although nausea and vomiting can make you feel miserable, it's important to remember that these are not diseases, but rather symptoms of an underlying illness.  When we treat short term symptoms, we always caution that any symptoms that persist should be fully evaluated in a medical office.  I have prescribed a medication that will help alleviate your symptoms and allow you to stay hydrated:  Promethazine 25 mg take 1 tablet twice daily  HOME CARE: Drink clear liquids.  This is very important! Dehydration (the lack of fluid) can lead to a serious complication.  Start off with 1 tablespoon every 5 minutes for 8 hours. You may begin eating bland foods after 8 hours without vomiting.  Start with saltine crackers, white bread, rice, mashed potatoes, applesauce. After 48 hours on a bland diet, you may resume a normal diet. Try to go to sleep.  Sleep often empties the stomach and relieves the need to vomit.  GET HELP RIGHT AWAY IF:  Your symptoms do not improve or worsen within 2 days after treatment. You have a fever for over 3 days. You cannot keep down fluids after trying the medication.  MAKE SURE YOU:  Understand these instructions. Will watch your condition. Will get help right away if you are not doing well or get worse.   Thank you for choosing an e-visit.  Your e-visit answers were reviewed by a board certified advanced clinical practitioner to complete your personal care plan. Depending upon the condition, your plan could have included both over the counter or prescription medications.  Please review your pharmacy choice. Make sure the pharmacy is open so you can pick up prescription now. If  there is a problem, you may contact your provider through MyChart messaging and have the prescription routed to another pharmacy.  Your safety is important to us. If you have drug allergies check your prescription carefully.   For the next 24 hours you can use MyChart to ask questions about today's visit, request a non-urgent call back, or ask for a work or school excuse. You will get an email in the next two days asking about your experience. I hope that your e-visit has been valuable and will speed your recovery.  

## 2023-09-17 NOTE — Progress Notes (Signed)
I have spent 5 minutes in review of e-visit questionnaire, review and updating patient chart, medical decision making and response to patient.   Mia Milan Cody Jacklynn Dehaas, PA-C    

## 2023-11-26 ENCOUNTER — Ambulatory Visit
Admission: EM | Admit: 2023-11-26 | Discharge: 2023-11-26 | Disposition: A | Discharge status: Home / Self Care | Payer: Medicaid Other | Attending: Family Medicine | Admitting: Family Medicine

## 2023-11-26 DIAGNOSIS — R000 Tachycardia, unspecified: Secondary | ICD-10-CM | POA: Diagnosis not present

## 2023-11-26 DIAGNOSIS — A749 Chlamydial infection, unspecified: Secondary | ICD-10-CM | POA: Diagnosis not present

## 2023-11-26 DIAGNOSIS — Z202 Contact with and (suspected) exposure to infections with a predominantly sexual mode of transmission: Secondary | ICD-10-CM | POA: Diagnosis not present

## 2023-11-26 LAB — HIV ANTIBODY (ROUTINE TESTING W REFLEX): HIV Screen 4th Generation wRfx: NONREACTIVE

## 2023-11-26 MED ORDER — DOXYCYCLINE HYCLATE 100 MG PO CAPS: 100.0000 mg | ORAL_CAPSULE | Freq: Two times a day (BID) | ORAL | 0 refills | Status: DC

## 2023-11-26 NOTE — Discharge Instructions (Addendum)
 Your STD test results will be available in the next 72 hours. If positive, someone will contact you.  You should see your results in your MyChart account.   You are being treated for chlamydia. Stop by the pharmacy to pick up your prescription.   Keep your new primary care appointment to discuss whether you need to see a cardiologist again about your elevated heart rate.

## 2023-11-26 NOTE — ED Provider Notes (Addendum)
 [facility]    CSN: [id number] Arrival date & time: 11/26/23  1544      History   Chief Complaint Chief Complaint  Patient presents with   Exposure to STD     HPI HPI Holly Wu is a 28 y.o. female.    Holly Wu presents for chlamydia exposure as her partner told her this morning that they had chlamydia.Holly Wu does not use condoms regularly. She is  not currently pregnant.  Patient's last menstrual period was 11/04/2023 (approximate).         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 Authorizing Provider  doxycycline  (VIBRAMYCIN ) 100 MG capsule Take 1 capsule (100 mg total) by mouth 2 (two) times daily. 11/26/23  Yes Kiyla Ringler, DO  acetaminophen  (TYLENOL ) 500 MG tablet Take 2 tablets (1,000 mg total) by mouth every 6 (six) hours. 04/11/22   Dominic, Jinnie Jansky, CNM  cephALEXin  (KEFLEX ) 500 MG capsule Take 1 capsule (500 mg total) by mouth 2 (two) times daily. 11/25/22   Vivienne Delon HERO, PA-C  ferrous sulfate  325 (65 FE) MG tablet Take 1 tablet (325 mg total) by mouth every other day. 04/13/22    Dominic, Jinnie Jansky, CNM  HYDROcodone -acetaminophen  (NORCO/VICODIN) 5-325 MG tablet Take 1-2 tablets by mouth every 4 (four) hours as needed for moderate pain. 06/13/22   Pabon, [name] F, MD  hydrocortisone  1 % ointment Apply 1 Application topically 2 (two) times daily. 07/19/23   Fleming, Zelda W, NP  ibuprofen  (ADVIL ) 600 MG tablet Take 1 tablet (600 mg total) by mouth every 6 (six) hours. Patient not taking: Reported on 06/12/2022 04/11/22   DominicJinnie Jansky, CNM  norelgestromin -ethinyl estradiol  (XULANE) 150-35 MCG/24HR transdermal patch Place 1 patch onto the skin once a week. Apply 1 patch weekly for 3 weeks, then 1 week without patch 04/30/23   Copland, Alicia B, PA-C  promethazine  (PHENERGAN ) 25 MG tablet Take 1 tablet (25 mg total) by mouth every 12 (twelve) hours as needed for nausea or vomiting. 09/17/23   Gladis Elsie BROCKS, PA-C    Family History Family History  Problem Relation Age of Onset   Diabetes Mother    Healthy Father    Colon cancer Maternal Grandfather    Breast cancer Paternal Grandmother    Colon cancer Maternal Aunt    Breast  cancer Other 23    Social History Social History   Tobacco Use   Smoking status: Never   Smokeless tobacco: Never  Vaping Use   Vaping status: Some Days  Substance Use Topics   Alcohol use: No   Drug use: No     Allergies   Toradol  [ketorolac  tromethamine ] and Zofran  [ondansetron ]   Review of Systems Review of Systems: :negative unless otherwise stated in HPI.      Physical Exam Triage Vital Signs ED Triage Vitals  Encounter Vitals Group     BP 11/26/23 1632 (!) 125/93     Systolic BP Percentile --      Diastolic BP Percentile --      Pulse Rate 11/26/23 1632 (!) 124     Resp 11/26/23 1632 19     Temp 11/26/23 1632 98.5 F (36.9 C)     Temp Source 11/26/23 1632 Oral     SpO2 11/26/23 1632 100 %     Weight --      Height --      Head Circumference --      Peak Flow --      Pain Score 11/26/23 1630 0     Pain  Loc --      Pain Education --      Exclude from Growth Chart --    No data found.  Updated Vital Signs BP (!) 125/93 (BP Location: Right Arm)   Pulse (!) 124   Temp 98.5 F (36.9 C) (Oral)   Resp 19   LMP 11/04/2023 (Approximate)   SpO2 100%   Visual Acuity Right Eye Distance:   Left Eye Distance:   Bilateral Distance:    Right Eye Near:   Left Eye Near:    Bilateral Near:     Physical Exam GEN: well appearing female in no acute distress  CVS: well perfused, tachycardic, no murmurs rubs or gallops RESP: speaking in full sentences without pause, clear to auscultation bilaterally GU: deferred, patient performed self swab     UC Treatments / Results  Labs (all labs ordered are listed, but only abnormal results are displayed) Labs Reviewed  CERVICOVAGINAL ANCILLARY ONLY - Abnormal; Notable for the following components:      Result Value   Chlamydia Positive (*)    All other components within normal limits  RPR  HIV ANTIBODY (ROUTINE TESTING W REFLEX)    EKG   Radiology No results found.  Procedures Procedures (including critical care time)  Medications Ordered in UC Medications - No data to display  Initial Impression / Assessment and Plan / UC Course  I have reviewed the triage vital signs and the nursing notes.  Pertinent labs & imaging results that were available during my care of the patient were reviewed by me and considered in my medical decision making (see chart for details).       Patient is a 28 y.o.Holly female who presents for STD testing.  Overall patient is well-appearing and afebrile. Treat with doxycycline  twice daily for 7 days for chlamydia exposure.  Obtained gonorrhea, chlamydia and trichomonas swab.  Recommended HIV and syphilis and she is agreeable.  Advised her to use condoms with every sexual encounter.  Safe sex handout provided.  She is tachycardia which is not new.  She was previously seen by pediatric cardiology who did a heart  monitor that was read on 04/19/2016 which showed predominantly sinus with rare non-complex  supraventricular and ventricular ectopy.  She denies excessive caffeine  use.  She does not take any ADHD medication.  Denies marijuana use.  Recommended patient follow-up with her new PCP as scheduled to discuss her elevated heart rate.   Discussed MDM, treatment plan and plan for follow-up with patient who agrees with plan.       Final Clinical Impressions(s) / UC Diagnoses   Final diagnoses:  STD exposure  Exposure to chlamydia  Tachycardia, unspecified  Chlamydia infection     Discharge Instructions       Your STD test results will be available in the next 72 hours. If positive, someone will contact you.  You should see your results in your MyChart account.   You are being treated for chlamydia. Stop by the pharmacy to pick up your prescription.   Keep your new primary care appointment to discuss whether you need to see a cardiologist again about your elevated heart rate.      ED Prescriptions     Medication Sig Dispense Auth. Provider   doxycycline  (VIBRAMYCIN ) 100 MG capsule Take 1 capsule (100 mg total) by mouth 2 (two) times daily. 14 capsule Eliyah Bazzi, DO      PDMP not reviewed this encounter.       Kriste Berth, DO 11/29/23 1340

## 2023-11-26 NOTE — ED Triage Notes (Signed)
 Patient states that she was exposed to Chlamydia 3 days ago from boyfriend.

## 2023-11-27 ENCOUNTER — Telehealth: Payer: Medicaid Other | Admitting: Physician Assistant

## 2023-11-27 ENCOUNTER — Encounter (INDEPENDENT_AMBULATORY_CARE_PROVIDER_SITE_OTHER): Payer: Self-pay

## 2023-11-27 LAB — CERVICOVAGINAL ANCILLARY ONLY
Chlamydia: POSITIVE — AB
Comment: NEGATIVE
Comment: NEGATIVE
Comment: NORMAL
Neisseria Gonorrhea: NEGATIVE
Trichomonas: NEGATIVE

## 2023-11-27 LAB — RPR: RPR Ser Ql: NONREACTIVE

## 2023-11-27 NOTE — Progress Notes (Signed)
 The patient no-showed for appointment despite this provider sending direct link with no response and waiting for at least 10 minutes from appointment time for patient to join. They will be marked as a NS for this appointment/time.   Piedad Climes, PA-C

## 2024-01-11 ENCOUNTER — Telehealth: Payer: Medicaid Other | Admitting: Family

## 2024-01-11 DIAGNOSIS — K0889 Other specified disorders of teeth and supporting structures: Secondary | ICD-10-CM

## 2024-01-11 MED ORDER — PROMETHAZINE HCL 12.5 MG PO TABS: 12.5000 mg | ORAL_TABLET | Freq: Three times a day (TID) | ORAL | 0 refills | Status: DC | PRN

## 2024-01-11 MED ORDER — IBUPROFEN 800 MG PO TABS: 800.0000 mg | ORAL_TABLET | Freq: Three times a day (TID) | ORAL | 0 refills | Status: DC | PRN

## 2024-01-11 NOTE — Addendum Note (Signed)
 Addended by: Jannifer Rodney A on: 01/11/2024 12:00 PM   Modules accepted: Orders

## 2024-01-11 NOTE — Progress Notes (Signed)

## 2024-01-21 ENCOUNTER — Telehealth: Admitting: Physician Assistant

## 2024-01-21 DIAGNOSIS — J019 Acute sinusitis, unspecified: Secondary | ICD-10-CM | POA: Diagnosis not present

## 2024-01-21 DIAGNOSIS — B9789 Other viral agents as the cause of diseases classified elsewhere: Secondary | ICD-10-CM | POA: Diagnosis not present

## 2024-01-21 IMAGING — US US OB FOLLOW-UP
1 series · 13 of 28 positions shown · non-contrast
Comparison: 11/14/2021
COMPARISON: 11/14/2021

Addendum:
CLINICAL DATA: Evaluate fetal size

EXAM:
OBSTETRIC 14+ WK ULTRASOUND FOLLOW-UP

[Series 1: us ob follow up · 13 of 37 slices shown]
[im 2/37]
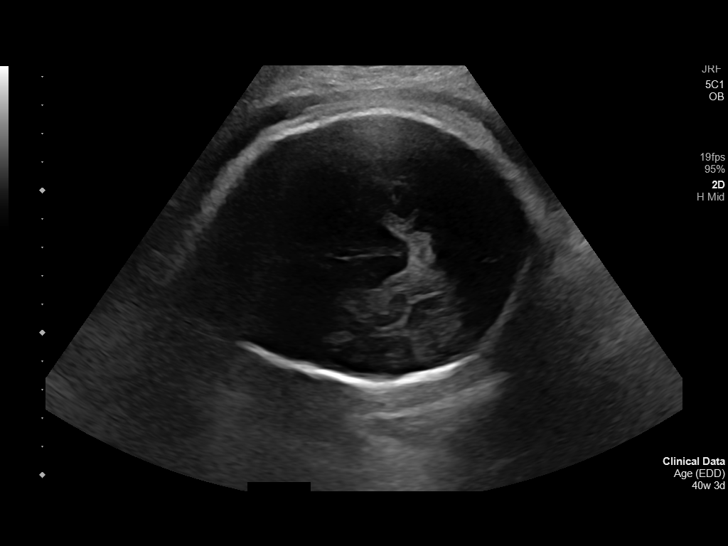
[im 5/37]
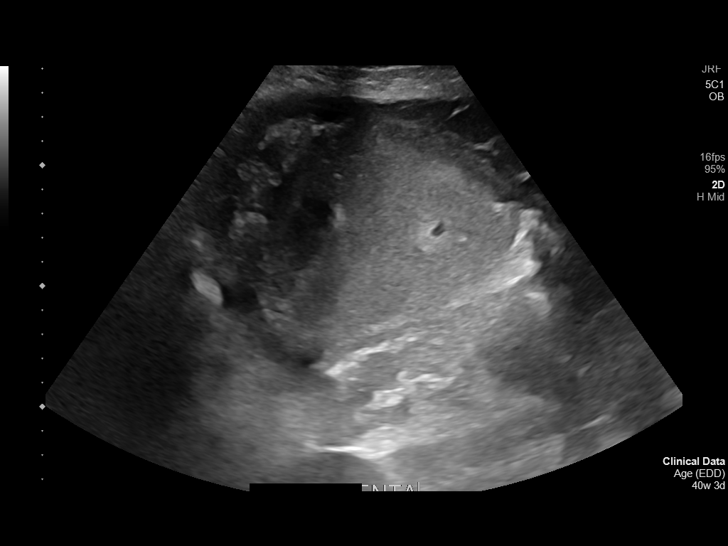
[im 7/37]
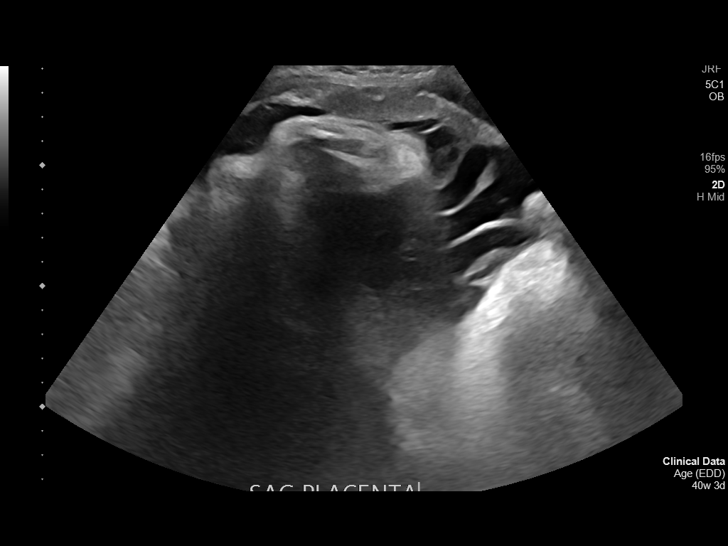
[im 10/37]
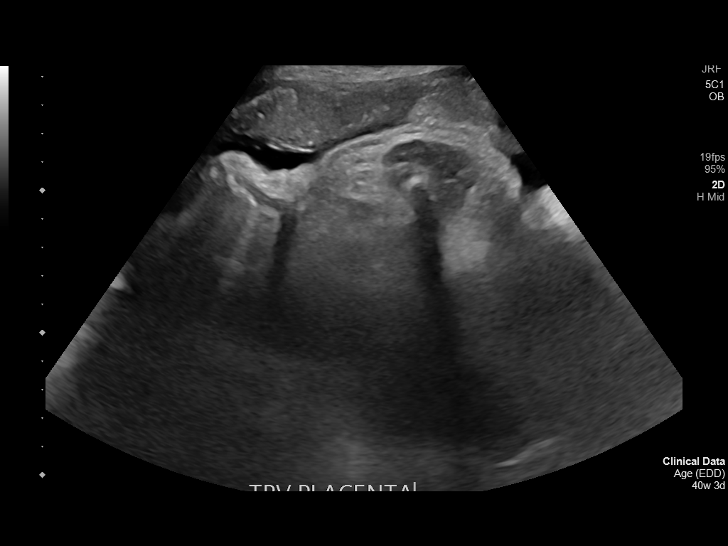
[im 13/37]
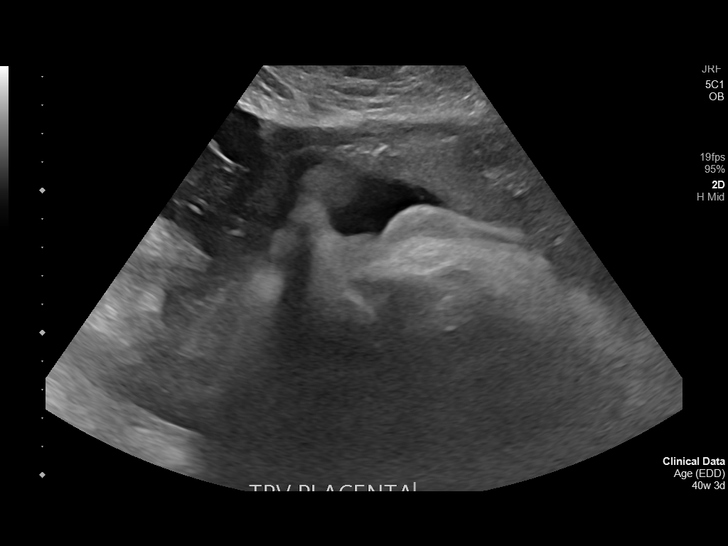
[im 15/37]
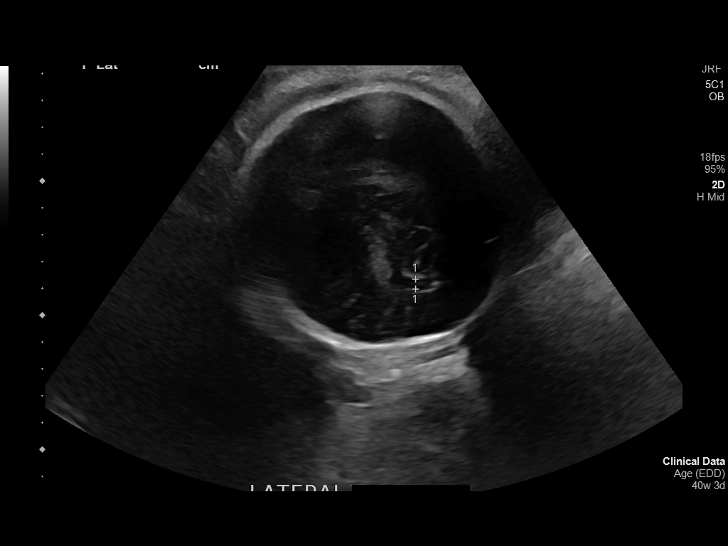
[im 19/37]
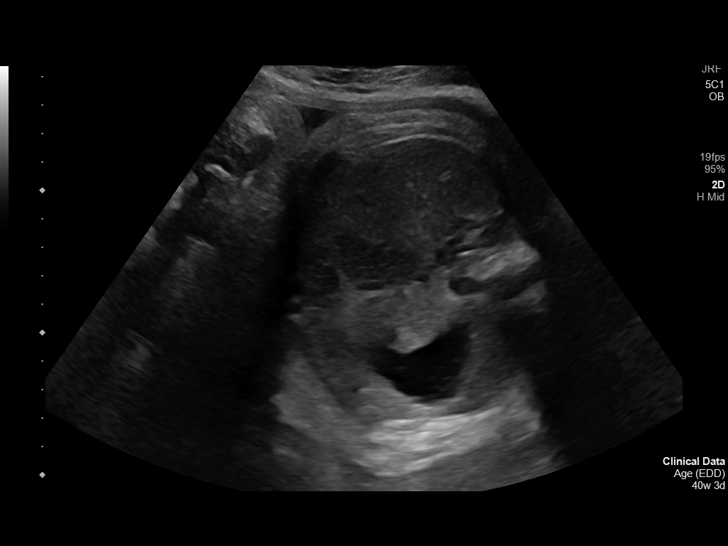
[im 22/37]
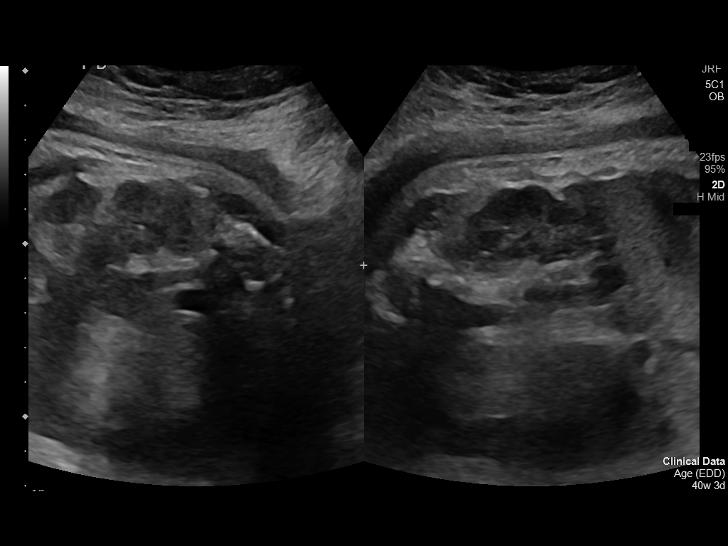
[im 25/37]
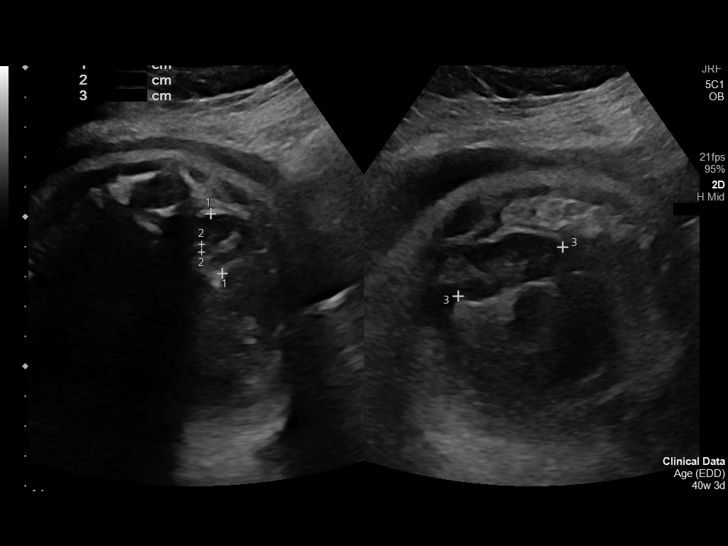
[im 27/37]
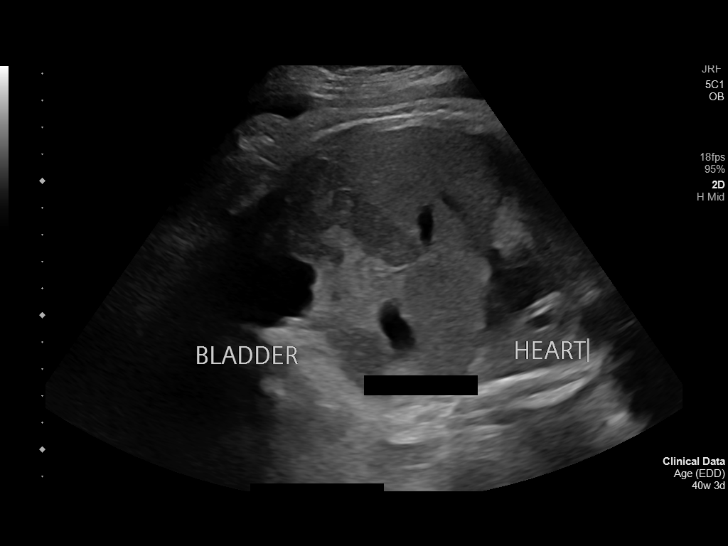
[im 30/37]
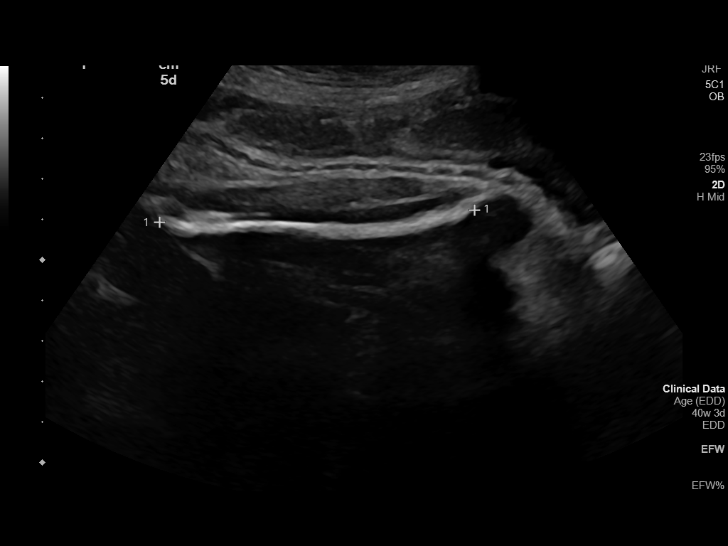
[im 33/37]
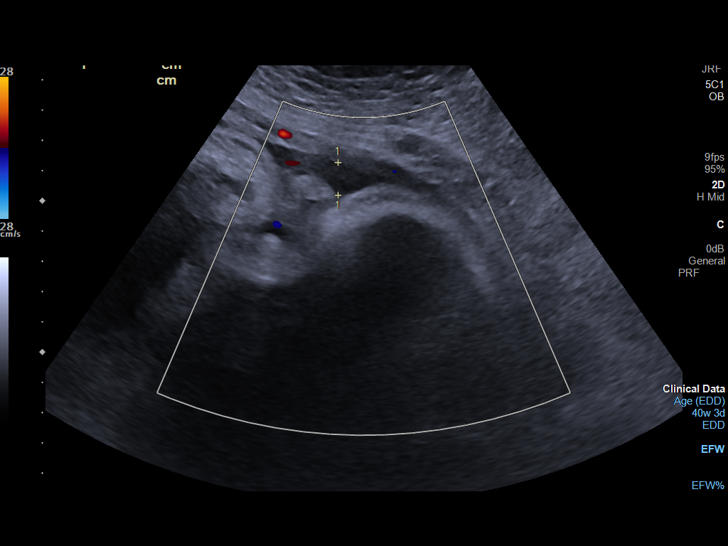
[im 35/37]
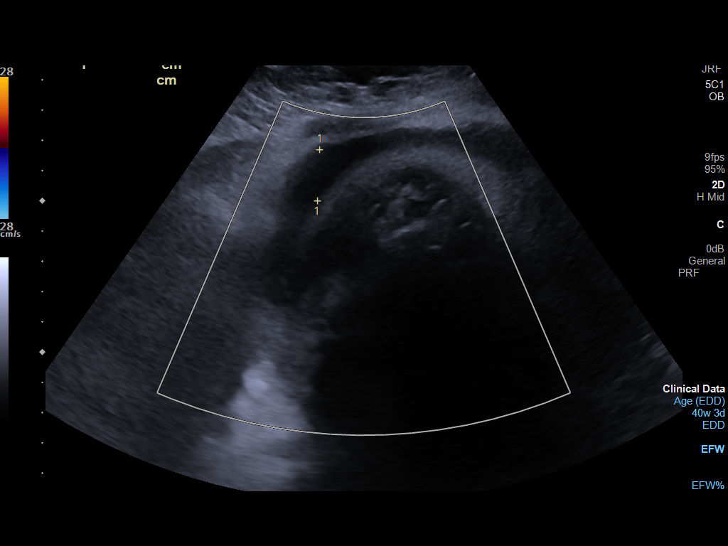

[13 of 28 positions shown; findings below may reference images not displayed]

FINDINGS: Number of Fetuses: 1

Heart Rate:  131 bpm

Movement: Yes

Presentation: Cephalic

Previa: No

Placental Location: Anterior

Amniotic Fluid (Subjective): Normal

Amniotic Fluid (Objective):

AFI = 8 cm

FETAL BIOMETRY

BPD: 9.61cm 39w 2d

HC:   35.36cm 41w 2d

AC:   37.28cm 41w 2d

FL:   7.74cm 39w 4d

Current Mean GA: 40w 0d US EDC: 04/04/2022

Assigned GA:  40w 3d Assigned EDC: 04/01/2022

Estimated Fetal Weight:  4,150g 83.5%ile

FETAL ANATOMY

Lateral Ventricles: Appears normal

Thalami/CSP: Appears normal

Posterior Fossa:  Previously seen

Nuchal Region: Previously seen   NFT= N/A > 20 WKS

Upper Lip: Previously seen

Spine: Previously seen

4 Chamber Heart on Left: Appears normal

LVOT: Previously seen

RVOT: Previously seen

Stomach on Left: Appears normal

3 Vessel Cord: Previously seen

Cord Insertion site: Previously seen

Kidneys: Appears normal

Bladder: Appears normal

Extremities: Previously seen

Sex: Male

Technically difficult due to: None
IMPRESSION: 1. Single live intrauterine pregnancy as detailed above.

ADDENDUM:
Typographical error in the original dictation. CORRECTION IS AS
FOLLOWS.

Fetal sex: FEMALE

*** End of Addendum ***
FINDINGS: Number of Fetuses: 1

Heart Rate:  131 bpm

Movement: Yes

Presentation: Cephalic

Previa: No

Placental Location: Anterior

Amniotic Fluid (Subjective): Normal

Amniotic Fluid (Objective):

AFI = 8 cm

FETAL BIOMETRY

BPD: 9.61cm 39w 2d

HC:   35.36cm 41w 2d

AC:   37.28cm 41w 2d

FL:   7.74cm 39w 4d

Current Mean GA: 40w 0d US EDC: 04/04/2022

Assigned GA:  40w 3d Assigned EDC: 04/01/2022

Estimated Fetal Weight:  4,150g 83.5%ile

FETAL ANATOMY

Lateral Ventricles: Appears normal

Thalami/CSP: Appears normal

Posterior Fossa:  Previously seen

Nuchal Region: Previously seen   NFT= N/A > 20 WKS

Upper Lip: Previously seen

Spine: Previously seen

4 Chamber Heart on Left: Appears normal

LVOT: Previously seen

RVOT: Previously seen

Stomach on Left: Appears normal

3 Vessel Cord: Previously seen

Cord Insertion site: Previously seen

Kidneys: Appears normal

Bladder: Appears normal

Extremities: Previously seen

Sex: Male

Technically difficult due to: None
IMPRESSION: 1. Single live intrauterine pregnancy as detailed above.

## 2024-01-22 MED ORDER — IPRATROPIUM BROMIDE 0.03 % NA SOLN: 2.0000 | Freq: Two times a day (BID) | NASAL | 0 refills | Status: DC

## 2024-01-22 NOTE — Progress Notes (Signed)

## 2024-01-22 NOTE — Progress Notes (Signed)
 I have spent 5 minutes in review of e-visit questionnaire, review and updating patient chart, medical decision making and response to patient.   Piedad Climes, PA-C

## 2024-02-08 ENCOUNTER — Encounter

## 2024-03-08 ENCOUNTER — Telehealth: Admitting: Physician Assistant

## 2024-03-08 DIAGNOSIS — O219 Vomiting of pregnancy, unspecified: Secondary | ICD-10-CM

## 2024-03-08 NOTE — Progress Notes (Signed)
 For the safety of you and your child, I recommend a face to face office visit with a health care provider.  Many mothers need to take medicines during their pregnancy and while nursing.  Almost all medicines pass into the breast milk in small quantities.  Most are generally considered safe for a mother to take but some medicines must be avoided.  After reviewing your E-Visit request and you stating you are having nausea and vomiting and are pregnant, I recommend that you consult your OB/GYN for medical advice in relation to your condition and prescription medications while pregnant.  NOTE:  There will be NO CHARGE for this eVisit  If you are having a true medical emergency please call 911.    For an urgent face to face visit, [place] has six urgent care centers for your convenience:     [facility] at [facility] Directions [phone] [address] [place], [place] [address]    [facility]) Get Driving Directions [phone] [address] [place], [place] [address]  [facility]) Get Driving Directions [phone] [address] [place],  [place]  [address]  [facility] at [facility] Get Driving Directions [phone] [address] [place], [place] [address]   [facility] at [facility] Get Driving Directions  [phone] [address] [place], [place] [address]   [facility] at [facility] Directions [phone] [address] [place], [place] [address]  Your MyChart E-visit questionnaire answers were reviewed by a board certified advanced clinical practitioner to complete your personal care plan based on your specific symptoms.  Thank you for using e-Visits.     I have spent 5 minutes in review of e-visit questionnaire, review and  updating patient chart, medical decision making and response to patient.   Angelia Kelp, PA-C

## 2024-04-26 ENCOUNTER — Encounter: Payer: Self-pay | Admitting: Family Medicine

## 2024-04-26 ENCOUNTER — Telehealth: Admitting: Family Medicine

## 2024-04-26 DIAGNOSIS — J101 Influenza due to other identified influenza virus with other respiratory manifestations: Secondary | ICD-10-CM

## 2024-04-26 MED ORDER — OSELTAMIVIR PHOSPHATE 75 MG PO CAPS: 75.0000 mg | ORAL_CAPSULE | Freq: Two times a day (BID) | ORAL | 0 refills | Status: AC

## 2024-04-26 NOTE — Progress Notes (Signed)
 E visit for Flu like symptoms   We are sorry that you are not feeling well.  Here is how we plan to help! Based on what you have shared with me it looks like you may have possible exposure to a virus that causes influenza.  Influenza or "the flu" is   an infection caused by a respiratory virus. The flu virus is highly contagious and persons who did not receive their yearly flu vaccination may "catch" the flu from close contact.  We have anti-viral medications to treat the viruses that cause this infection. They are not a "cure" and only shorten the course of the infection. These prescriptions are most effective when they are given within the first 2 days of "flu" symptoms. Antiviral medication are indicated if you have a high risk of complications from the flu. You should  also consider an antiviral medication if you are in close contact with someone who is at risk. These medications can help patients avoid complications from the flu  but have side effects that you should know. Possible side effects from Tamiflu or oseltamivir include nausea, vomiting, diarrhea, dizziness, headaches, eye redness, sleep problems or other respiratory symptoms. You should not take Tamiflu if you have an allergy to oseltamivir or any to the ingredients in Tamiflu.  Based upon your symptoms and potential risk factors I have prescribed Oseltamivir (Tamiflu).  It has been sent to your designated pharmacy.  You will take one 75 mg capsule orally twice a day for the next 5 days. and I recommend that you follow the flu symptoms recommendation that I have listed below.  ANYONE WHO HAS FLU SYMPTOMS SHOULD: Stay home. The flu is highly contagious and going out or to work exposes others! Be sure to drink plenty of fluids. Water is fine as well as fruit juices, sodas and electrolyte beverages. You may want to stay away from caffeine or alcohol. If you are nauseated, try taking small sips of liquids. How do you know if you are getting  enough fluid? Your urine should be a pale yellow or almost colorless. Get rest. Taking a steamy shower or using a humidifier may help nasal congestion and ease sore throat pain. Using a saline nasal spray works much the same way. Cough drops, hard candies and sore throat lozenges may ease your cough. Line up a caregiver. Have someone check on you regularly.   GET HELP RIGHT AWAY IF: You cannot keep down liquids or your medications. You become short of breath Your fell like you are going to pass out or loose consciousness. Your symptoms persist after you have completed your treatment plan MAKE SURE YOU  Understand these instructions. Will watch your condition. Will get help right away if you are not doing well or get worse.  Your e-visit answers were reviewed by a board certified advanced clinical practitioner to complete your personal care plan.  Depending on the condition, your plan could have included both over the counter or prescription medications.  If there is a problem please reply  once you have received a response from your provider.  Your safety is important to [place].  If you have drug allergies check your prescription carefully.    You can use MyChart to ask questions about today's visit, request a non-urgent call back, or ask for a work or school excuse for 24 hours related to this e-Visit. If it has been greater than 24 hours you will need to follow up with your provider, or enter  a new e-Visit to address those concerns.  You will get an e-mail in the next two days asking about your experience.  I hope that your e-visit has been valuable and will speed your recovery. Thank you for using e-visits.  I provided 5 minutes of non face-to-face time during this encounter for chart review, medication and order placement, as well as and documentation.

## 2024-04-28 ENCOUNTER — Telehealth: Admitting: Physician Assistant

## 2024-04-28 DIAGNOSIS — L308 Other specified dermatitis: Secondary | ICD-10-CM

## 2024-04-28 MED ORDER — TRIAMCINOLONE ACETONIDE 0.1 % EX CREA: 1.0000 | TOPICAL_CREAM | Freq: Two times a day (BID) | CUTANEOUS | 0 refills | Status: DC

## 2024-04-28 NOTE — Progress Notes (Signed)
 E Visit for Rash  We are sorry that you are not feeling well. Here is how we plan to help!  I do not know exactly the cause but the rash does have eczematous qualities to it so I will prescribe Triamcinolone cream 0.1% Apply topically twice daily. I do recommend to follow up in person if not improving.    HOME CARE:  Take cool showers and avoid direct sunlight. Apply cool compress or wet dressings. Take a bath in an oatmeal bath.  Sprinkle content of one Aveeno packet under running faucet with comfortably warm water.  Bathe for 15-20 minutes, 1-2 times daily.  Pat dry with a towel. Do not rub the rash. Use hydrocortisone  cream. Take an antihistamine like Benadryl  for widespread rashes that itch.  The adult dose of Benadryl  is 25-50 mg by mouth 4 times daily. Caution:  This type of medication may cause sleepiness.  Do not drink alcohol, drive, or operate dangerous machinery while taking antihistamines.  Do not take these medications if you have prostate enlargement.  Read package instructions thoroughly on all medications that you take.  GET HELP RIGHT AWAY IF:  Symptoms don't go away after treatment. Severe itching that persists. If you rash spreads or swells. If you rash begins to smell. If it blisters and opens or develops a yellow-brown crust. You develop a fever. You have a sore throat. You become short of breath.  MAKE SURE YOU:  Understand these instructions. Will watch your condition. Will get help right away if you are not doing well or get worse.  Thank you for choosing an e-visit.  Your e-visit answers were reviewed by a board certified advanced clinical practitioner to complete your personal care plan. Depending upon the condition, your plan could have included both over the counter or prescription medications.  Please review your pharmacy choice. Make sure the pharmacy is open so you can pick up prescription now. If there is a problem, you may contact your provider  through [employer] and have the prescription routed to another pharmacy.  Your safety is important to us . If you have drug allergies check your prescription carefully.   For the next 24 hours you can use MyChart to ask questions about today's visit, request a non-urgent call back, or ask for a work or school excuse. You will get an email in the next two days asking about your experience. I hope that your e-visit has been valuable and will speed your recovery.   I have spent 5 minutes in review of e-visit questionnaire, review and updating patient chart, medical decision making and response to patient.   Angelia Kelp, PA-C

## 2024-09-19 ENCOUNTER — Other Ambulatory Visit: Payer: Self-pay

## 2024-09-19 ENCOUNTER — Observation Stay
Admission: EM | Admit: 2024-09-19 | Discharge: 2024-09-19 | Disposition: A | Discharge status: Home / Self Care | Attending: Obstetrics and Gynecology | Admitting: Obstetrics and Gynecology

## 2024-09-19 ENCOUNTER — Encounter: Payer: Self-pay | Admitting: *Deleted

## 2024-09-19 ENCOUNTER — Observation Stay

## 2024-09-19 DIAGNOSIS — K219 Gastro-esophageal reflux disease without esophagitis: Principal | ICD-10-CM | POA: Diagnosis present

## 2024-09-19 DIAGNOSIS — Z8719 Personal history of other diseases of the digestive system: Secondary | ICD-10-CM | POA: Diagnosis not present

## 2024-09-19 DIAGNOSIS — O34211 Maternal care for low transverse scar from previous cesarean delivery: Secondary | ICD-10-CM | POA: Diagnosis not present

## 2024-09-19 DIAGNOSIS — M419 Scoliosis, unspecified: Secondary | ICD-10-CM | POA: Diagnosis not present

## 2024-09-19 DIAGNOSIS — O26893 Other specified pregnancy related conditions, third trimester: Principal | ICD-10-CM | POA: Insufficient documentation

## 2024-09-19 DIAGNOSIS — O0933 Supervision of pregnancy with insufficient antenatal care, third trimester: Secondary | ICD-10-CM

## 2024-09-19 DIAGNOSIS — O99353 Diseases of the nervous system complicating pregnancy, third trimester: Secondary | ICD-10-CM | POA: Diagnosis not present

## 2024-09-19 DIAGNOSIS — Z3A37 37 weeks gestation of pregnancy: Secondary | ICD-10-CM | POA: Insufficient documentation

## 2024-09-19 DIAGNOSIS — R112 Nausea with vomiting, unspecified: Secondary | ICD-10-CM | POA: Diagnosis present

## 2024-09-19 DIAGNOSIS — N762 Acute vulvitis: Secondary | ICD-10-CM | POA: Diagnosis not present

## 2024-09-19 DIAGNOSIS — Z3689 Encounter for other specified antenatal screening: Secondary | ICD-10-CM | POA: Diagnosis not present

## 2024-09-19 DIAGNOSIS — R12 Heartburn: Secondary | ICD-10-CM | POA: Diagnosis not present

## 2024-09-19 DIAGNOSIS — Z8742 Personal history of other diseases of the female genital tract: Secondary | ICD-10-CM | POA: Diagnosis not present

## 2024-09-19 DIAGNOSIS — Z8679 Personal history of other diseases of the circulatory system: Secondary | ICD-10-CM | POA: Insufficient documentation

## 2024-09-19 DIAGNOSIS — Z98891 History of uterine scar from previous surgery: Secondary | ICD-10-CM

## 2024-09-19 DIAGNOSIS — O99613 Diseases of the digestive system complicating pregnancy, third trimester: Secondary | ICD-10-CM | POA: Diagnosis not present

## 2024-09-19 LAB — COMPREHENSIVE METABOLIC PANEL WITH GFR
ALT: 9 U/L (ref 0–44)
AST: 17 U/L (ref 15–41)
Albumin: 2.3 g/dL — ABNORMAL LOW (ref 3.5–5.0)
Alkaline Phosphatase: 135 U/L — ABNORMAL HIGH (ref 38–126)
Anion gap: 10 (ref 5–15)
BUN: 10 mg/dL (ref 6–20)
CO2: 23 mmol/L (ref 22–32)
Calcium: 8.6 mg/dL — ABNORMAL LOW (ref 8.9–10.3)
Chloride: 101 mmol/L (ref 98–111)
Creatinine, Ser: 0.68 mg/dL (ref 0.44–1.00)
GFR, Estimated: >60 mL/min (ref >60)
Glucose, Bld: 108 mg/dL — ABNORMAL HIGH (ref 70–99)
Potassium: 4 mmol/L (ref 3.5–5.1)
Sodium: 134 mmol/L — ABNORMAL LOW (ref 135–145)
Total Bilirubin: 0.3 mg/dL (ref 0.0–1.2)
Total Protein: 6.9 g/dL (ref 6.5–8.1)

## 2024-09-19 LAB — DIFFERENTIAL
Abs Immature Granulocytes: 0.14 K/uL — ABNORMAL HIGH (ref 0.00–0.07)
Basophils Absolute: 0.1 K/uL (ref 0.0–0.1)
Basophils Relative: 0 %
Eosinophils Absolute: 0.1 K/uL (ref 0.0–0.5)
Eosinophils Relative: 0 %
Immature Granulocytes: 1 %
Lymphocytes Relative: 20 %
Lymphs Abs: 2.3 K/uL (ref 0.7–4.0)
Monocytes Absolute: 0.5 K/uL (ref 0.1–1.0)
Monocytes Relative: 5 %
Neutro Abs: 8.3 K/uL — ABNORMAL HIGH (ref 1.7–7.7)
Neutrophils Relative %: 74 %

## 2024-09-19 LAB — URINE DRUG SCREEN, QUALITATIVE (ARMC ONLY)
Amphetamines, Ur Screen: NOT DETECTED
Barbiturates, Ur Screen: NOT DETECTED
Benzodiazepine, Ur Scrn: NOT DETECTED
Cannabinoid 50 Ng, Ur ~~LOC~~: NOT DETECTED
Cocaine Metabolite,Ur ~~LOC~~: NOT DETECTED
MDMA (Ecstasy)Ur Screen: NOT DETECTED
Methadone Scn, Ur: NOT DETECTED
Opiate, Ur Screen: NOT DETECTED
Phencyclidine (PCP) Ur S: NOT DETECTED
Tricyclic, Ur Screen: NOT DETECTED

## 2024-09-19 LAB — TYPE AND SCREEN
ABO/RH(D): A POS
Antibody Screen: NEGATIVE

## 2024-09-19 LAB — OB RESULTS CONSOLE GC/CHLAMYDIA
Chlamydia: NEGATIVE
Neisseria Gonorrhea: NEGATIVE

## 2024-09-19 LAB — WET PREP, GENITAL
Sperm: NONE SEEN
Trich, Wet Prep: NONE SEEN
WBC, Wet Prep HPF POC: <10 (ref <10)
Yeast Wet Prep HPF POC: NONE SEEN

## 2024-09-19 LAB — CBC
HCT: 34.4 % — ABNORMAL LOW (ref 36.0–46.0)
Hemoglobin: 10.7 g/dL — ABNORMAL LOW (ref 12.0–15.0)
MCH: 24.7 pg — ABNORMAL LOW (ref 26.0–34.0)
MCHC: 31.1 g/dL (ref 30.0–36.0)
MCV: 79.4 fL — ABNORMAL LOW (ref 80.0–100.0)
Platelets: 201 K/uL (ref 150–400)
RBC: 4.33 MIL/uL (ref 3.87–5.11)
RDW: 14.3 % (ref 11.5–15.5)
WBC: 11.3 K/uL — ABNORMAL HIGH (ref 4.0–10.5)
nRBC: 0 % (ref 0.0–0.2)

## 2024-09-19 LAB — OB RESULTS CONSOLE GBS: GBS: NEGATIVE

## 2024-09-19 LAB — URINALYSIS, COMPLETE (UACMP) WITH MICROSCOPIC
Bilirubin Urine: NEGATIVE
Glucose, UA: NEGATIVE mg/dL
Hgb urine dipstick: NEGATIVE
Ketones, ur: NEGATIVE mg/dL
Leukocytes,Ua: NEGATIVE
Nitrite: NEGATIVE
Protein, ur: NEGATIVE mg/dL
Specific Gravity, Urine: 1.008 (ref 1.005–1.030)
pH: 6 (ref 5.0–8.0)

## 2024-09-19 LAB — HEMOGLOBIN A1C
Hgb A1c MFr Bld: 5.9 % — ABNORMAL HIGH (ref 4.8–5.6)
Mean Plasma Glucose: 122.63 mg/dL

## 2024-09-19 LAB — CHLAMYDIA/NGC RT PCR (ARMC ONLY)
Chlamydia Tr: NOT DETECTED
N gonorrhoeae: NOT DETECTED

## 2024-09-19 LAB — HEPATITIS B SURFACE ANTIGEN: Hepatitis B Surface Ag: NONREACTIVE

## 2024-09-19 LAB — HIV ANTIBODY (ROUTINE TESTING W REFLEX): HIV Screen 4th Generation wRfx: NONREACTIVE

## 2024-09-19 LAB — OB RESULTS CONSOLE HIV ANTIBODY (ROUTINE TESTING): HIV: NONREACTIVE

## 2024-09-19 MED ORDER — METRONIDAZOLE 500 MG PO TABS: 500.0000 mg | ORAL_TABLET | Freq: Two times a day (BID) | ORAL | 0 refills | Status: AC

## 2024-09-19 MED ORDER — FAMOTIDINE 20 MG PO TABS
40.0000 mg | ORAL_TABLET | Freq: Once | ORAL | Status: AC
  Administered 2024-09-19: 40 mg via ORAL
  Filled 2024-09-19: qty 2

## 2024-09-19 MED ORDER — ACETAMINOPHEN 325 MG PO TABS: 650.0000 mg | ORAL_TABLET | ORAL | Status: DC | PRN

## 2024-09-19 MED ORDER — FAMOTIDINE 40 MG PO TABS: 40.0000 mg | ORAL_TABLET | Freq: Every evening | ORAL | 0 refills | Status: AC

## 2024-09-19 NOTE — OB Triage Note (Signed)
 Pt reports co acid reflux, nausea and vomiting. Denies diarrhea. Reports + fetal movement. Denies vaginal bleeding, or LOF. Holly Wu

## 2024-09-19 NOTE — Discharge Summary (Signed)
 Patient ID: Holly Wu MRN: [id number] DOB/AGE: 13-Jan-1996 28 y.o.  Admit date: 09/19/2024 Discharge date: 09/19/2024  Admission Diagnoses: 28yo G2P1 at [redacted]w[redacted]d with no prenatal care with complaints of heartburn.  Discharge Diagnoses: [redacted]w[redacted]d pregnancy with resolved GERD sx  Factors complicating pregnancy: No prenatal care History of c/s History of PCOS Idiopathic scoliosis History of right bundle branch block History of cholecystitis  Prenatal Procedures: NST  Consults: None  Significant Diagnostic Studies:  Results for orders placed or performed during the hospital encounter of 09/19/24 (from the past week)  Wet prep, genital   Collection Time: 09/19/24 10:42 AM   Specimen: Vaginal  Result Value Ref Range   Yeast Wet Prep HPF POC NONE SEEN NONE SEEN   Trich, Wet Prep NONE SEEN NONE SEEN   Clue Cells Wet Prep HPF POC PRESENT (A) NONE SEEN   WBC, Wet Prep HPF POC <10 <10   Sperm NONE SEEN   Chlamydia/NGC rt PCR ([facility] only)   Collection Time: 09/19/24 10:42 AM   Specimen: Vaginal  Result Value Ref Range   Specimen source GC/Chlam ENDOCERVICAL    Chlamydia Tr NOT DETECTED NOT DETECTED   N gonorrhoeae NOT DETECTED NOT DETECTED  Urinalysis, Complete w Microscopic -Urine, Clean Catch   Collection Time: 09/19/24 10:42 AM  Result Value Ref Range   Color, Urine STRAW (A) YELLOW   APPearance CLEAR (A) CLEAR   Specific Gravity, Urine 1.008 1.005 - 1.030   pH 6.0 5.0 - 8.0   Glucose, UA NEGATIVE NEGATIVE mg/dL   Hgb urine dipstick NEGATIVE NEGATIVE   Bilirubin Urine NEGATIVE NEGATIVE   Ketones, ur NEGATIVE NEGATIVE mg/dL   Protein, ur NEGATIVE NEGATIVE mg/dL   Nitrite NEGATIVE NEGATIVE   Leukocytes,Ua NEGATIVE NEGATIVE   RBC / HPF 0-5 0 - 5 RBC/hpf   WBC, UA 0-5 0 - 5 WBC/hpf   Bacteria, UA RARE (A) NONE SEEN   Squamous Epithelial / HPF 0-5 0 - 5 /HPF  Urine Drug Screen, Qualitative ([facility] only)   Collection Time: 09/19/24 10:42 AM  Result Value Ref Range    Tricyclic, Ur Screen NONE DETECTED NONE DETECTED   Amphetamines, Ur Screen NONE DETECTED NONE DETECTED   MDMA (Ecstasy)Ur Screen NONE DETECTED NONE DETECTED   Cocaine Metabolite,Ur [place] NONE DETECTED NONE DETECTED   Opiate, Ur Screen NONE DETECTED NONE DETECTED   Phencyclidine (PCP) Ur S NONE DETECTED NONE DETECTED   Cannabinoid 50 Ng, Ur [place] NONE DETECTED NONE DETECTED   Barbiturates, Ur Screen NONE DETECTED NONE DETECTED   Benzodiazepine, Ur Scrn NONE DETECTED NONE DETECTED   Methadone Scn, Ur NONE DETECTED NONE DETECTED  CBC   Collection Time: 09/19/24  1:08 PM  Result Value Ref Range   WBC 11.3 (H) 4.0 - 10.5 K/uL   RBC 4.33 3.87 - 5.11 MIL/uL   Hemoglobin 10.7 (L) 12.0 - 15.0 g/dL   HCT 65.5 (L) 63.9 - 53.9 %   MCV 79.4 (L) 80.0 - 100.0 fL   MCH 24.7 (L) 26.0 - 34.0 pg   MCHC 31.1 30.0 - 36.0 g/dL   RDW 85.6 88.4 - 84.4 %   Platelets 201 150 - 400 K/uL   nRBC 0.0 0.0 - 0.2 %  Differential   Collection Time: 09/19/24  1:08 PM  Result Value Ref Range   Neutrophils Relative % 74 %   Neutro Abs 8.3 (H) 1.7 - 7.7 K/uL   Lymphocytes Relative 20 %   Lymphs Abs 2.3 0.7 - 4.0 K/uL   Monocytes  Relative 5 %   Monocytes Absolute 0.5 0.1 - 1.0 K/uL   Eosinophils Relative 0 %   Eosinophils Absolute 0.1 0.0 - 0.5 K/uL   Basophils Relative 0 %   Basophils Absolute 0.1 0.0 - 0.1 K/uL   Immature Granulocytes 1 %   Abs Immature Granulocytes 0.14 (H) 0.00 - 0.07 K/uL  Comprehensive metabolic panel   Collection Time: 09/19/24  1:08 PM  Result Value Ref Range   Sodium 134 (L) 135 - 145 mmol/L   Potassium 4.0 3.5 - 5.1 mmol/L   Chloride 101 98 - 111 mmol/L   CO2 23 22 - 32 mmol/L   Glucose, Bld 108 (H) 70 - 99 mg/dL   BUN 10 6 - 20 mg/dL   Creatinine, Ser 9.31 0.44 - 1.00 mg/dL   Calcium 8.6 (L) 8.9 - 10.3 mg/dL   Total Protein 6.9 6.5 - 8.1 g/dL   Albumin 2.3 (L) 3.5 - 5.0 g/dL   AST 17 15 - 41 U/L   ALT 9 0 - 44 U/L   Alkaline Phosphatase 135 (H) 38 - 126 U/L   Total Bilirubin 0.3  0.0 - 1.2 mg/dL   GFR, Estimated >39 >39 mL/min   Anion gap 10 5 - 15  Type and screen   Collection Time: 09/19/24  1:08 PM  Result Value Ref Range   ABO/RH(D) A POS    Antibody Screen NEG    Sample Expiration      09/22/2024,2359 Performed at [facility], [address], [place], [place] [address]     Treatments: none  Hospital Course:  This is a 28 y.o. G2P1001 with IUP at [redacted]w[redacted]d seen for heartburn.  No leaking of fluid and no bleeding.  All prenatal labs were collected, detailed u/s completed.  She was observed, fetal heart rate monitoring remained reassuring, and she had no signs/symptoms of  labor or other maternal-fetal concerns.  She was deemed stable for discharge to home with outpatient follow up.  Medications prescribed   metroNIDAZOLE (FLAGYL) 500 MG tablet    Sig: Take 1 tablet (500 mg total) by mouth 2 (two) times daily for 7 days.    Dispense:  14 tablet    Refill:  0    Supervising Provider:   JACKSON, STEPHEN D [[id number]]   famotidine (PEPCID) 40 MG tablet    Sig: Take 1 tablet (40 mg total) by mouth every evening.    Dispense:  30 tablet    Refill:  0    Supervising Provider:   LEONCE GARNETTE BIRCH [[id number]]     Discharge Physical Exam:  BP 124/82 (BP Location: Left Arm)   Pulse 83   Temp 97.6 F (36.4 C) (Oral)   Resp 18   Ht 5' 2 (1.575 m)   Wt 83.9 kg   LMP 12/31/2023   BMI 33.84 kg/m   General: NAD CV: RRR Pulm: nl effort ABD: s/nd/nt, gravid DVT Evaluation: LE non-ttp, no evidence of DVT on exam.  NST: FHR baseline: 135 bpm Variability: moderate Accelerations: yes Decelerations: none Category/reactivity: reactive  TOCO: quiet SVE: deferred      Discharge Condition: Stable  Disposition: Discharge disposition: 01-Home or Self Care        Allergies as of 09/19/2024       Reactions   Toradol  [ketorolac  Tromethamine ] Rash   Zofran  [ondansetron ] Rash        Medication List     STOP taking these medications     doxycycline  100 MG capsule  Commonly known as: VIBRAMYCIN    ferrous sulfate  325 (65 FE) MG tablet   HYDROcodone -acetaminophen  5-325 MG tablet Commonly known as: NORCO/VICODIN   hydrocortisone  1 % ointment   ibuprofen  800 MG tablet Commonly known as: ADVIL    ipratropium 0.03 % nasal spray Commonly known as: ATROVENT    promethazine  12.5 MG tablet Commonly known as: PHENERGAN    triamcinolone  cream 0.1 % Commonly known as: KENALOG        TAKE these medications    acetaminophen  500 MG tablet Commonly known as: TYLENOL  Take 2 tablets (1,000 mg total) by mouth every 6 (six) hours.   famotidine 40 MG tablet Commonly known as: PEPCID Take 1 tablet (40 mg total) by mouth every evening.   metroNIDAZOLE 500 MG tablet Commonly known as: FLAGYL Take 1 tablet (500 mg total) by mouth 2 (two) times daily for 7 days.   multivitamin-prenatal 27-0.8 MG Tabs tablet Take 1 tablet by mouth daily at 12 noon.        Follow-up Information     Leonce Garnette BIRCH, MD Follow up.   Specialty: Obstetrics and Gynecology Why: Someone from our office will call to make an appointment for you to arrange your cesarean section Contact information: [address] [place] [address] [phone]                 Signed:  DELON MYRON HOWARD 09/19/2024 3:01 PM

## 2024-09-19 NOTE — OB Triage Note (Signed)
 Discharged home. Leaving floor ambulatory with significant other. Holly Wu

## 2024-09-20 LAB — RPR: RPR Ser Ql: NONREACTIVE

## 2024-09-21 LAB — RUBELLA SCREEN: Rubella: <0.9 {index} — ABNORMAL LOW (ref >0.99)

## 2024-09-21 LAB — CULTURE, BETA STREP (GROUP B ONLY)

## 2024-09-23 DIAGNOSIS — O0993 Supervision of high risk pregnancy, unspecified, third trimester: Secondary | ICD-10-CM | POA: Diagnosis not present

## 2024-09-23 DIAGNOSIS — Z98891 History of uterine scar from previous surgery: Secondary | ICD-10-CM | POA: Diagnosis not present

## 2024-09-23 DIAGNOSIS — Z3A38 38 weeks gestation of pregnancy: Secondary | ICD-10-CM | POA: Diagnosis not present

## 2024-09-23 DIAGNOSIS — O0933 Supervision of pregnancy with insufficient antenatal care, third trimester: Secondary | ICD-10-CM | POA: Diagnosis not present

## 2024-09-23 NOTE — H&P (View-Only) (Signed)
 OB History & Physical   History of Present Illness:  Chief Complaint: here for pre-op for repeat cesarean delivery  HPI:  Holly Wu is a 28 y.o. G2P1001 female at [redacted]w[redacted]d dated by LMP consistent with 37 week ultrsaound.  Her pregnancy has been complicated by history of c-section, no prenatal care.    She denies contractions.   She denies leakage of fluid.   She denies vaginal bleeding.   She reports fetal movement.     Maternal Medical History:   Past Medical History:  Diagnosis Date   GERD (gastroesophageal reflux disease)    History of C-section    History of cholecystitis    History of PCOS    History of right bundle branch block    Idiopathic scoliosis    Pyelonephritis    Secondary oligomenorrhea     Past Surgical History:  Procedure Laterality Date   XI ROBOTIC ASSISTED LAPAROSCOPIC CHOLECYSTECTOMY  06/13/2022   Jordis Laneta FALCON, MD  [address]  [address]  [place], [place] [address]  [phone]   CESAREAN SECTION 04/08/2022     Procedure: CESAREAN SECTION; Surgeon: Connell Davies, MD; Location: [facility] ORS; Service: Obstetrics;;   ESOPHAGUS SURGERY     foreign object removed    Allergies  Allergen Reactions   Ketorolac  Rash   Ondansetron  Rash    Prior to Admission medications  Medication Sig Taking? Last Dose  famotidine (PEPCID) 40 MG tablet Take 40 mg by mouth every evening Yes Taking  metroNIDAZOLE (FLAGYL) 500 MG tablet Take 500 mg by mouth 2 (two) times daily Yes Taking  acetaminophen  (TYLENOL ) 500 MG tablet Take 1,000 mg by mouth Patient not taking: Reported on 09/23/2024  Not Taking  EMZAHH 0.35 mg tablet Take 0.35 mg by mouth once daily Patient not taking: Reported on 09/23/2024  Not Taking    OB History  Gravida Para Term Preterm AB Living  2 1 1   1   SAB IAB Ectopic Molar Multiple Live Births       1    # Outcome Date GA Lbr Len/2nd Weight Sex Type Anes PTL Lv  2 Current           1 Term 04/08/22 [redacted]w[redacted]d  4.17 kg (9 lb 3.1 oz) F CS-LTranv EPI,  Gen  LIV    Prenatal care site: No prenatal care. First care received this past weekend on Labor and Delivery triage.  Social History: She  reports that she has never smoked. She has never used smokeless tobacco. She reports that she does not use drugs.  Family History: family history includes Breast cancer in her paternal grandmother and another family member; Colon cancer in her maternal aunt and maternal grandfather; Diabetes in her mother.   Review of Systems  Constitutional: Negative.   HENT: Negative.    Eyes: Negative.   Respiratory: Negative.    Cardiovascular: Negative.   Gastrointestinal: Negative.   Genitourinary: Negative.   Musculoskeletal: Negative.   Skin: Negative.   Neurological: Negative.   Endo/Heme/Allergies: Negative.   Psychiatric/Behavioral: Negative.       Physical Exam:  BP 119/65   Pulse 102   Ht 157.5 cm (5' 2)   Wt 86.8 kg (191 lb 6.4 oz)   LMP 12/31/2023   BMI 35.01 kg/m   Physical Exam Constitutional:      General: She is not in acute distress.    Appearance: Normal appearance.  HENT:     Head: Normocephalic and atraumatic.  Eyes:  General: No scleral icterus.    Conjunctiva/sclera: Conjunctivae normal.  Cardiovascular:     Rate and Rhythm: Normal rate and regular rhythm.     Heart sounds: No murmur heard.    No friction rub. No gallop.  Pulmonary:     Effort: Pulmonary effort is normal. No respiratory distress.     Breath sounds: Normal breath sounds. No wheezing, rhonchi or rales.  Abdominal:     General: Bowel sounds are normal. There is no distension.     Palpations: Abdomen is soft. There is no mass.     Tenderness: There is no abdominal tenderness. There is no guarding or rebound.  Musculoskeletal:        General: No swelling. Normal range of motion.  Neurological:     General: No focal deficit present.     Mental Status: She is oriented to person, place, and time.     Cranial Nerves: No cranial nerve deficit.  Skin:     General: Skin is warm and dry.     Findings: No lesion.  Psychiatric:        Mood and Affect: Mood normal.        Behavior: Behavior normal.        Judgment: Judgment normal.  Vitals and nursing note reviewed.      Pertinent Results:  Prenatal Labs (all labs 09/19/2024, unless otherwise stated) Blood type/Rh A positive  Antibody screen negative  Rubella Not immune  Varicella Unknown    RPR NR  HBsAg negative  HIV negative on 09/19/2024  GC negative  Chlamydia negative  Genetic screening Not done  1 hour GTT N/a  3 hour GTT N/a  Hemoglobin a1c 5.9  GBS negative on 09/19/2024   See ultrasound from [facility] on 09/19/2024  Assessment:  Holly Wu is a 28 y.o. G2P1001 female at [redacted]w[redacted]d with no PNC, history of c-section, here to discuss repeat and sign consents.   Plan:  Admit to Labor & Delivery  CBC, T&S, NPO, IVF GBS negative.   Sign consents today for repeat cesarean delivery on 09/19/2024   Savahna Casados DANIEL Bernerd Terhune, MD 09/23/2024 5:35 PM

## 2024-09-23 NOTE — Progress Notes (Signed)
 OB History & Physical   History of Present Illness:  Chief Complaint: here for pre-op for repeat cesarean delivery  HPI:  Holly Wu is a 28 y.o. G2P1001 female at [redacted]w[redacted]d dated by LMP consistent with 37 week ultrsaound.  Her pregnancy has been complicated by history of c-section, no prenatal care.    She denies contractions.   She denies leakage of fluid.   She denies vaginal bleeding.   She reports fetal movement.     Maternal Medical History:   Past Medical History:  Diagnosis Date   GERD (gastroesophageal reflux disease)    History of C-section    History of cholecystitis    History of PCOS    History of right bundle branch block    Idiopathic scoliosis    Pyelonephritis    Secondary oligomenorrhea     Past Surgical History:  Procedure Laterality Date   XI ROBOTIC ASSISTED LAPAROSCOPIC CHOLECYSTECTOMY  06/13/2022   Jordis Laneta FALCON, MD  [address]  [address]  [place], [place] [address]  [phone]   CESAREAN SECTION 04/08/2022     Procedure: CESAREAN SECTION; Surgeon: Connell Davies, MD; Location: [facility] ORS; Service: Obstetrics;;   ESOPHAGUS SURGERY     foreign object removed    Allergies  Allergen Reactions   Ketorolac  Rash   Ondansetron  Rash    Prior to Admission medications  Medication Sig Taking? Last Dose  famotidine (PEPCID) 40 MG tablet Take 40 mg by mouth every evening Yes Taking  metroNIDAZOLE (FLAGYL) 500 MG tablet Take 500 mg by mouth 2 (two) times daily Yes Taking  acetaminophen  (TYLENOL ) 500 MG tablet Take 1,000 mg by mouth Patient not taking: Reported on 09/23/2024  Not Taking  EMZAHH 0.35 mg tablet Take 0.35 mg by mouth once daily Patient not taking: Reported on 09/23/2024  Not Taking    OB History  Gravida Para Term Preterm AB Living  2 1 1   1   SAB IAB Ectopic Molar Multiple Live Births       1    # Outcome Date GA Lbr Len/2nd Weight Sex Type Anes PTL Lv  2 Current           1 Term 04/08/22 [redacted]w[redacted]d  4.17 kg (9 lb 3.1 oz) F CS-LTranv EPI,  Gen  LIV    Prenatal care site: No prenatal care. First care received this past weekend on Labor and Delivery triage.  Social History: She  reports that she has never smoked. She has never used smokeless tobacco. She reports that she does not use drugs.  Family History: family history includes Breast cancer in her paternal grandmother and another family member; Colon cancer in her maternal aunt and maternal grandfather; Diabetes in her mother.   Review of Systems  Constitutional: Negative.   HENT: Negative.    Eyes: Negative.   Respiratory: Negative.    Cardiovascular: Negative.   Gastrointestinal: Negative.   Genitourinary: Negative.   Musculoskeletal: Negative.   Skin: Negative.   Neurological: Negative.   Endo/Heme/Allergies: Negative.   Psychiatric/Behavioral: Negative.       Physical Exam:  BP 119/65   Pulse 102   Ht 157.5 cm (5' 2)   Wt 86.8 kg (191 lb 6.4 oz)   LMP 12/31/2023   BMI 35.01 kg/m   Physical Exam Constitutional:      General: She is not in acute distress.    Appearance: Normal appearance.  HENT:     Head: Normocephalic and atraumatic.  Eyes:  General: No scleral icterus.    Conjunctiva/sclera: Conjunctivae normal.  Cardiovascular:     Rate and Rhythm: Normal rate and regular rhythm.     Heart sounds: No murmur heard.    No friction rub. No gallop.  Pulmonary:     Effort: Pulmonary effort is normal. No respiratory distress.     Breath sounds: Normal breath sounds. No wheezing, rhonchi or rales.  Abdominal:     General: Bowel sounds are normal. There is no distension.     Palpations: Abdomen is soft. There is no mass.     Tenderness: There is no abdominal tenderness. There is no guarding or rebound.  Musculoskeletal:        General: No swelling. Normal range of motion.  Neurological:     General: No focal deficit present.     Mental Status: She is oriented to person, place, and time.     Cranial Nerves: No cranial nerve deficit.  Skin:     General: Skin is warm and dry.     Findings: No lesion.  Psychiatric:        Mood and Affect: Mood normal.        Behavior: Behavior normal.        Judgment: Judgment normal.  Vitals and nursing note reviewed.      Pertinent Results:  Prenatal Labs (all labs 09/19/2024, unless otherwise stated) Blood type/Rh A positive  Antibody screen negative  Rubella Not immune  Varicella Unknown    RPR NR  HBsAg negative  HIV negative on 09/19/2024  GC negative  Chlamydia negative  Genetic screening Not done  1 hour GTT N/a  3 hour GTT N/a  Hemoglobin a1c 5.9  GBS negative on 09/19/2024   See ultrasound from [facility] on 09/19/2024  Assessment:  Holly Wu is a 28 y.o. G2P1001 female at [redacted]w[redacted]d with no PNC, history of c-section, here to discuss repeat and sign consents.   Plan:  Admit to Labor & Delivery  CBC, T&S, NPO, IVF GBS negative.   Sign consents today for repeat cesarean delivery on 09/19/2024   Savahna Casados DANIEL Bernerd Terhune, MD 09/23/2024 5:35 PM

## 2024-09-23 NOTE — H&P (Signed)
 OB History & Physical   History of Present Illness:  Chief Complaint: here for pre-op for repeat cesarean delivery  HPI:  Holly Wu is a 28 y.o. G2P1001 female at [redacted]w[redacted]d dated by LMP consistent with 37 week ultrsaound.  Her pregnancy has been complicated by history of c-section, no prenatal care.    She denies contractions.   She denies leakage of fluid.   She denies vaginal bleeding.   She reports fetal movement.     Maternal Medical History:   Past Medical History:  Diagnosis Date   GERD (gastroesophageal reflux disease)    History of C-section    History of cholecystitis    History of PCOS    History of right bundle branch block    Idiopathic scoliosis    Pyelonephritis    Secondary oligomenorrhea     Past Surgical History:  Procedure Laterality Date   XI ROBOTIC ASSISTED LAPAROSCOPIC CHOLECYSTECTOMY  06/13/2022   Jordis Laneta FALCON, MD  [address]  [address]  [place], [place] [address]  [phone]   CESAREAN SECTION 04/08/2022     Procedure: CESAREAN SECTION; Surgeon: Connell Davies, MD; Location: [facility] ORS; Service: Obstetrics;;   ESOPHAGUS SURGERY     foreign object removed    Allergies  Allergen Reactions   Ketorolac  Rash   Ondansetron  Rash    Prior to Admission medications  Medication Sig Taking? Last Dose  famotidine (PEPCID) 40 MG tablet Take 40 mg by mouth every evening Yes Taking  metroNIDAZOLE (FLAGYL) 500 MG tablet Take 500 mg by mouth 2 (two) times daily Yes Taking  acetaminophen  (TYLENOL ) 500 MG tablet Take 1,000 mg by mouth Patient not taking: Reported on 09/23/2024  Not Taking  EMZAHH 0.35 mg tablet Take 0.35 mg by mouth once daily Patient not taking: Reported on 09/23/2024  Not Taking    OB History  Gravida Para Term Preterm AB Living  2 1 1   1   SAB IAB Ectopic Molar Multiple Live Births       1    # Outcome Date GA Lbr Len/2nd Weight Sex Type Anes PTL Lv  2 Current           1 Term 04/08/22 [redacted]w[redacted]d  4.17 kg (9 lb 3.1 oz) F CS-LTranv EPI,  Gen  LIV    Prenatal care site: No prenatal care. First care received this past weekend on Labor and Delivery triage.  Social History: She  reports that she has never smoked. She has never used smokeless tobacco. She reports that she does not use drugs.  Family History: family history includes Breast cancer in her paternal grandmother and another family member; Colon cancer in her maternal aunt and maternal grandfather; Diabetes in her mother.   Review of Systems  Constitutional: Negative.   HENT: Negative.    Eyes: Negative.   Respiratory: Negative.    Cardiovascular: Negative.   Gastrointestinal: Negative.   Genitourinary: Negative.   Musculoskeletal: Negative.   Skin: Negative.   Neurological: Negative.   Endo/Heme/Allergies: Negative.   Psychiatric/Behavioral: Negative.       Physical Exam:  BP 119/65   Pulse 102   Ht 157.5 cm (5' 2)   Wt 86.8 kg (191 lb 6.4 oz)   LMP 12/31/2023   BMI 35.01 kg/m   Physical Exam Constitutional:      General: She is not in acute distress.    Appearance: Normal appearance.  HENT:     Head: Normocephalic and atraumatic.  Eyes:  General: No scleral icterus.    Conjunctiva/sclera: Conjunctivae normal.  Cardiovascular:     Rate and Rhythm: Normal rate and regular rhythm.     Heart sounds: No murmur heard.    No friction rub. No gallop.  Pulmonary:     Effort: Pulmonary effort is normal. No respiratory distress.     Breath sounds: Normal breath sounds. No wheezing, rhonchi or rales.  Abdominal:     General: Bowel sounds are normal. There is no distension.     Palpations: Abdomen is soft. There is no mass.     Tenderness: There is no abdominal tenderness. There is no guarding or rebound.  Musculoskeletal:        General: No swelling. Normal range of motion.  Neurological:     General: No focal deficit present.     Mental Status: She is oriented to person, place, and time.     Cranial Nerves: No cranial nerve deficit.  Skin:     General: Skin is warm and dry.     Findings: No lesion.  Psychiatric:        Mood and Affect: Mood normal.        Behavior: Behavior normal.        Judgment: Judgment normal.  Vitals and nursing note reviewed.      Pertinent Results:  Prenatal Labs (all labs 09/19/2024, unless otherwise stated) Blood type/Rh A positive  Antibody screen negative  Rubella Not immune  Varicella Unknown    RPR NR  HBsAg negative  HIV negative on 09/19/2024  GC negative  Chlamydia negative  Genetic screening Not done  1 hour GTT N/a  3 hour GTT N/a  Hemoglobin a1c 5.9  GBS negative on 09/19/2024   See ultrasound from [facility] on 09/19/2024  Assessment:  Holly Wu is a 28 y.o. G2P1001 female at [redacted]w[redacted]d with no PNC, history of c-section, here to discuss repeat and sign consents.   Plan:  Admit to Labor & Delivery  CBC, T&S, NPO, IVF GBS negative.   Sign consents today for repeat cesarean delivery on 09/19/2024   Savahna Casados DANIEL Bernerd Terhune, MD 09/23/2024 5:35 PM

## 2024-09-24 ENCOUNTER — Other Ambulatory Visit: Payer: Self-pay | Admitting: Medical Genetics

## 2024-09-27 ENCOUNTER — Encounter
Admission: RE | Admit: 2024-09-27 | Discharge: 2024-09-27 | Disposition: A | Discharge status: Home / Self Care | Source: Ambulatory Visit | Attending: Obstetrics and Gynecology | Admitting: Obstetrics and Gynecology

## 2024-09-27 ENCOUNTER — Other Ambulatory Visit: Payer: Self-pay

## 2024-09-27 VITALS — Ht 62.0 in | Wt 190.0 lb

## 2024-09-27 DIAGNOSIS — Z8679 Personal history of other diseases of the circulatory system: Secondary | ICD-10-CM

## 2024-09-27 DIAGNOSIS — Z01812 Encounter for preprocedural laboratory examination: Secondary | ICD-10-CM

## 2024-09-27 DIAGNOSIS — Z01810 Encounter for preprocedural cardiovascular examination: Secondary | ICD-10-CM

## 2024-09-27 HISTORY — DX: Cardiac murmur, unspecified: R01.1

## 2024-09-27 HISTORY — DX: Anemia, unspecified: D64.9

## 2024-09-27 NOTE — Patient Instructions (Addendum)
 Your procedure is scheduled on: Wednesday 09/29/24  Arrival Time: Please call Labor and Delivery the day before your scheduled C-Section to find out your arrival time. [phone].  Arrival: If your arrival time is prior to 6:00 am, please enter through the Emergency Room Entrance and you will be directed to Labor and Delivery. If your arrival time is 6:00 am or later, please enter the Medical Mall and follow the greeter's instructions.  REMEMBER: Instructions that are not followed completely may result in serious medical risk, up to and including death; or upon the discretion of your surgeon and anesthesiologist your surgery may need to be rescheduled.  Do not eat food or drink fluids after midnight the night before surgery.  No gum chewing or hard candies.   One week prior to surgery: Stop Anti-inflammatories (NSAIDS) such as Advil , Aleve, Ibuprofen , Motrin , Naproxen, Naprosyn and Aspirin based products such as Excedrin, Goody's Powder, BC Powder. Stop ANY OVER THE COUNTER supplements until after surgery.  You may however, continue to take Tylenol  if needed for pain up until the day of surgery.   Continue taking all of your other prescription medications up until the day of surgery.  ON THE DAY OF SURGERY ONLY TAKE THESE MEDICATIONS WITH SIPS OF WATER:  None   Use inhalers on the day of surgery and bring to the hospital.  No Alcohol for 24 hours before or after surgery.  No Smoking including e-cigarettes for 24 hours prior to surgery.  No chewable tobacco products for at least 6 hours prior to surgery.  No nicotine patches on the day of surgery.  Do not use any recreational drugs for at least a week prior to your surgery.  Please be advised that the combination of cocaine and anesthesia may have negative outcomes, up to and including death. If you test positive for cocaine, your surgery will be cancelled.  On the morning of surgery brush your teeth with toothpaste and  water, you may rinse your mouth with mouthwash if you wish. Do not swallow any toothpaste or mouthwash.  Use CHG wipes as directed on instruction sheet.  Do not wear jewelry, make-up, hairpins, clips or nail polish.  For welded (permanent) jewelry: bracelets, anklets, waist bands, etc.  Please have this removed prior to surgery.  If it is not removed, there is a chance that hospital personnel will need to cut it off on the day of surgery.  Do not wear lotions, powders, or perfumes.   Do not shave body hair from the neck down 48 hours before surgery.  Contact lenses, hearing aids and dentures may not be worn into surgery.  Do not bring valuables to the hospital. [facility] is not responsible for any missing/lost belongings or valuables.   Bring your C-PAP to the hospital with you in case you may have to spend the night.   Notify your doctor if there is any change in your medical condition (cold, fever, infection).  Wear comfortable clothing (specific to your surgery type) to the hospital.  After surgery, you can help prevent lung complications by doing breathing exercises.  Take deep breaths and cough every 1-2 hours. Your doctor may order a device called an Incentive Spirometer to help you take deep breaths. When coughing or sneezing, hold a pillow firmly against your incision with both hands. This is called "splinting." Doing this helps protect your incision. It also decreases belly discomfort.  Please call the Pre-admissions Testing Dept. at [phone] if you have any questions  about these instructions.  Surgery Visitation Policy:  Visitor Passes   All visitors, including children, need an identification sticker when visiting. These stickers must be worn where they can be seen.   Labor & Delivery  Laboring women may have one designated support person and two other visitors of any age visit. The support person must remain the same. The visitors may switch with other  visitors. Visitation is permitted 24 hours per day. The designated support person or a visitor over the age of 16 may sleep overnight in the patient's room. A doula registered with [facility] for labor and delivery support is not considered a visitor. Doulas not registered with  are considered visitors.  Mother Baby Unit, OB Specialty and Gynecological Care  A designated support person and three visitors of any age may visit. The three visitors may switch out. The designated support person or a visitor age 31 or older may stay overnight in the room. During the postpartum period (up to 6 weeks), if the mother is the patient, she can have her newborn stay with her if there is another support person present who can be responsible for the baby.                                                                                                             Preparing for Surgery with CHLORHEXIDINE  GLUCONATE (CHG) Soap  Chlorhexidine  Gluconate (CHG) Soap  o An antiseptic cleaner that kills germs and bonds with the skin to continue killing germs even after washing  o Used for showering the night before surgery and morning of surgery  Before surgery, you can play an important role by reducing the number of germs on your skin.  CHG (Chlorhexidine  gluconate) soap is an antiseptic cleanser which kills germs and bonds with the skin to continue killing germs even after washing.  Please do not use if you have an allergy to CHG or antibacterial soaps. If your skin becomes reddened/irritated stop using the CHG.  1. Shower the NIGHT BEFORE SURGERY with CHG soap.  2. If you choose to wash your hair, wash your hair first as usual with your normal shampoo.  3. After shampooing, rinse your hair and body thoroughly to remove the shampoo.  4. Use CHG as you would any other liquid soap. You can apply CHG directly to the skin and wash gently with a clean washcloth.  5. Apply the CHG soap to your body  only from the neck down. Do not use on open wounds or open sores. Avoid contact with your eyes, ears, mouth, and genitals (private parts). Wash face and genitals (private parts) with your normal soap.  6. Wash thoroughly, paying special attention to the area where your surgery will be performed.  7. Thoroughly rinse your body with warm water.  8. Do not shower/wash with your normal soap after using and rinsing off the CHG soap.  9. Do not use lotions, oils, etc., after showering with CHG.  10. Pat yourself dry with a clean towel.  11.  Wear clean pajamas to bed the night before surgery.  12. Place clean sheets on your bed the night of your shower and do not sleep with pets.  13. Do not apply any deodorants/lotions/powders.  14. Please wear clean clothes to the hospital.  15. Remember to brush your teeth with your regular toothpaste.

## 2024-09-28 ENCOUNTER — Encounter: Payer: Self-pay | Admitting: Urgent Care

## 2024-09-28 ENCOUNTER — Encounter
Admission: RE | Admit: 2024-09-28 | Discharge: 2024-09-28 | Disposition: A | Discharge status: Home / Self Care | Source: Ambulatory Visit | Attending: Obstetrics and Gynecology | Admitting: Obstetrics and Gynecology

## 2024-09-28 DIAGNOSIS — Z01818 Encounter for other preprocedural examination: Secondary | ICD-10-CM | POA: Insufficient documentation

## 2024-09-28 DIAGNOSIS — Z8679 Personal history of other diseases of the circulatory system: Secondary | ICD-10-CM | POA: Insufficient documentation

## 2024-09-28 DIAGNOSIS — Z01810 Encounter for preprocedural cardiovascular examination: Secondary | ICD-10-CM

## 2024-09-28 DIAGNOSIS — Z01812 Encounter for preprocedural laboratory examination: Secondary | ICD-10-CM

## 2024-09-28 LAB — CBC
HCT: 33.2 % — ABNORMAL LOW (ref 36.0–46.0)
Hemoglobin: 10.2 g/dL — ABNORMAL LOW (ref 12.0–15.0)
MCH: 24.1 pg — ABNORMAL LOW (ref 26.0–34.0)
MCHC: 30.7 g/dL (ref 30.0–36.0)
MCV: 78.5 fL — ABNORMAL LOW (ref 80.0–100.0)
Platelets: 211 K/uL (ref 150–400)
RBC: 4.23 MIL/uL (ref 3.87–5.11)
RDW: 14.9 % (ref 11.5–15.5)
WBC: 13.4 K/uL — ABNORMAL HIGH (ref 4.0–10.5)
nRBC: 0 % (ref 0.0–0.2)

## 2024-09-28 LAB — TYPE AND SCREEN
ABO/RH(D): A POS
Antibody Screen: NEGATIVE
Extend sample reason: UNDETERMINED

## 2024-09-29 ENCOUNTER — Inpatient Hospital Stay: Admission: RE | Admit: 2024-09-29 | Payer: Self-pay | Source: Ambulatory Visit | Admitting: Obstetrics and Gynecology

## 2024-09-29 ENCOUNTER — Other Ambulatory Visit: Payer: Self-pay

## 2024-09-29 ENCOUNTER — Encounter: Payer: Self-pay | Admitting: Obstetrics and Gynecology

## 2024-09-29 ENCOUNTER — Inpatient Hospital Stay
Admission: EM | Admit: 2024-09-29 | Discharge: 2024-10-01 | DRG: 786 | Disposition: A | Discharge status: Home / Self Care

## 2024-09-29 ENCOUNTER — Inpatient Hospital Stay: Admitting: Certified Registered"

## 2024-09-29 ENCOUNTER — Inpatient Hospital Stay: Payer: Self-pay | Admitting: Urgent Care

## 2024-09-29 ENCOUNTER — Encounter: Admission: RE | Disposition: A | Payer: Self-pay | Source: Home / Self Care | Attending: Obstetrics and Gynecology

## 2024-09-29 DIAGNOSIS — I4510 Unspecified right bundle-branch block: Secondary | ICD-10-CM | POA: Diagnosis not present

## 2024-09-29 DIAGNOSIS — Z3A38 38 weeks gestation of pregnancy: Secondary | ICD-10-CM | POA: Diagnosis not present

## 2024-09-29 DIAGNOSIS — Z833 Family history of diabetes mellitus: Secondary | ICD-10-CM | POA: Diagnosis not present

## 2024-09-29 DIAGNOSIS — Z3A00 Weeks of gestation of pregnancy not specified: Secondary | ICD-10-CM | POA: Diagnosis not present

## 2024-09-29 DIAGNOSIS — Z3A39 39 weeks gestation of pregnancy: Secondary | ICD-10-CM

## 2024-09-29 DIAGNOSIS — O0933 Supervision of pregnancy with insufficient antenatal care, third trimester: Secondary | ICD-10-CM | POA: Diagnosis not present

## 2024-09-29 DIAGNOSIS — O34219 Maternal care for unspecified type scar from previous cesarean delivery: Secondary | ICD-10-CM | POA: Diagnosis not present

## 2024-09-29 DIAGNOSIS — K219 Gastro-esophageal reflux disease without esophagitis: Secondary | ICD-10-CM | POA: Diagnosis present

## 2024-09-29 DIAGNOSIS — D62 Acute posthemorrhagic anemia: Secondary | ICD-10-CM | POA: Diagnosis not present

## 2024-09-29 DIAGNOSIS — O9081 Anemia of the puerperium: Secondary | ICD-10-CM | POA: Diagnosis not present

## 2024-09-29 DIAGNOSIS — O479 False labor, unspecified: Secondary | ICD-10-CM | POA: Diagnosis not present

## 2024-09-29 DIAGNOSIS — Z98891 History of uterine scar from previous surgery: Secondary | ICD-10-CM

## 2024-09-29 DIAGNOSIS — O9942 Diseases of the circulatory system complicating childbirth: Secondary | ICD-10-CM | POA: Diagnosis present

## 2024-09-29 DIAGNOSIS — O34211 Maternal care for low transverse scar from previous cesarean delivery: Principal | ICD-10-CM | POA: Diagnosis present

## 2024-09-29 DIAGNOSIS — Z8679 Personal history of other diseases of the circulatory system: Secondary | ICD-10-CM

## 2024-09-29 DIAGNOSIS — O9962 Diseases of the digestive system complicating childbirth: Secondary | ICD-10-CM | POA: Diagnosis present

## 2024-09-29 DIAGNOSIS — I493 Ventricular premature depolarization: Secondary | ICD-10-CM | POA: Diagnosis present

## 2024-09-29 LAB — URINE DRUG SCREEN
Amphetamines: NEGATIVE
Barbiturates: NEGATIVE
Benzodiazepines: NEGATIVE
Cocaine: NEGATIVE
Fentanyl: NEGATIVE
Methadone Scn, Ur: NEGATIVE
Opiates: NEGATIVE
Tetrahydrocannabinol: NEGATIVE

## 2024-09-29 SURGERY — Surgical Case
Anesthesia: Spinal | Site: Abdomen

## 2024-09-29 MED ORDER — DIPHENHYDRAMINE HCL 25 MG PO CAPS
25.0000 mg | ORAL_CAPSULE | Freq: Four times a day (QID) | ORAL | Status: DC | PRN
Start: 2024-09-29 — End: 2024-10-01

## 2024-09-29 MED ORDER — MORPHINE SULFATE (PF) 0.5 MG/ML IJ SOLN
INTRAMUSCULAR | Status: DC | PRN
  Administered 2024-09-29: .1 mg via INTRATHECAL

## 2024-09-29 MED ORDER — ORAL CARE MOUTH RINSE: 15.0000 mL | Freq: Once | OROMUCOSAL | Status: AC

## 2024-09-29 MED ORDER — MEPERIDINE HCL 25 MG/ML IJ SOLN: 6.2500 mg | INTRAMUSCULAR | Status: DC | PRN

## 2024-09-29 MED ORDER — ONDANSETRON HCL 4 MG/2ML IJ SOLN
INTRAMUSCULAR | Status: DC | PRN
  Administered 2024-09-29: 4 mg via INTRAVENOUS

## 2024-09-29 MED ORDER — MENTHOL 3 MG MT LOZG: 1.0000 | LOZENGE | OROMUCOSAL | Status: DC | PRN

## 2024-09-29 MED ORDER — DIPHENHYDRAMINE HCL 50 MG/ML IJ SOLN: 12.5000 mg | INTRAMUSCULAR | Status: DC | PRN

## 2024-09-29 MED ORDER — LACTATED RINGERS IV SOLN: INTRAVENOUS | Status: DC

## 2024-09-29 MED ORDER — 0.9 % SODIUM CHLORIDE (POUR BTL) OPTIME
TOPICAL | Status: DC | PRN
  Administered 2024-09-29: 1000 mL

## 2024-09-29 MED ORDER — SIMETHICONE 80 MG PO CHEW
80.0000 mg | CHEWABLE_TABLET | Freq: Three times a day (TID) | ORAL | Status: DC
  Administered 2024-09-30 – 2024-10-01 (×4): 80 mg via ORAL
  Filled 2024-09-29 (×4): qty 1

## 2024-09-29 MED ORDER — OXYTOCIN-SODIUM CHLORIDE 30-0.9 UT/500ML-% IV SOLN
INTRAVENOUS | Status: AC
  Filled 2024-09-29: qty 1000

## 2024-09-29 MED ORDER — CEFAZOLIN SODIUM-DEXTROSE 2-4 GM/100ML-% IV SOLN
2.0000 g | INTRAVENOUS | Status: AC
  Administered 2024-09-29: 2 g via INTRAVENOUS

## 2024-09-29 MED ORDER — MORPHINE SULFATE (PF) 0.5 MG/ML IJ SOLN
INTRAMUSCULAR | Status: AC
  Filled 2024-09-29: qty 10

## 2024-09-29 MED ORDER — BUPIVACAINE HCL (PF) 0.5 % IJ SOLN
INTRAMUSCULAR | Status: AC
  Filled 2024-09-29: qty 30

## 2024-09-29 MED ORDER — CHLORHEXIDINE GLUCONATE 0.12 % MT SOLN
15.0000 mL | Freq: Once | OROMUCOSAL | Status: AC
  Administered 2024-09-29: 15 mL via OROMUCOSAL
  Filled 2024-09-29: qty 15

## 2024-09-29 MED ORDER — SOD CITRATE-CITRIC ACID 500-334 MG/5ML PO SOLN
ORAL | Status: AC
  Filled 2024-09-29: qty 15

## 2024-09-29 MED ORDER — ONDANSETRON HCL 4 MG/2ML IJ SOLN
INTRAMUSCULAR | Status: AC
  Filled 2024-09-29: qty 2

## 2024-09-29 MED ORDER — DIBUCAINE (PERIANAL) 1 % EX OINT
1.0000 | TOPICAL_OINTMENT | CUTANEOUS | Status: DC | PRN
Start: 2024-09-29 — End: 2024-10-01

## 2024-09-29 MED ORDER — FERROUS SULFATE 325 (65 FE) MG PO TABS
325.0000 mg | ORAL_TABLET | Freq: Two times a day (BID) | ORAL | Status: DC
  Administered 2024-09-30 – 2024-10-01 (×3): 325 mg via ORAL
  Filled 2024-09-29 (×4): qty 1

## 2024-09-29 MED ORDER — FENTANYL CITRATE (PF) 100 MCG/2ML IJ SOLN
INTRAMUSCULAR | Status: AC
  Filled 2024-09-29: qty 2

## 2024-09-29 MED ORDER — FENTANYL CITRATE (PF) 100 MCG/2ML IJ SOLN
INTRAMUSCULAR | Status: DC | PRN
  Administered 2024-09-29: 15 ug via INTRATHECAL

## 2024-09-29 MED ORDER — OXYCODONE-ACETAMINOPHEN 5-325 MG PO TABS: 2.0000 | ORAL_TABLET | ORAL | Status: DC | PRN

## 2024-09-29 MED ORDER — BUPIVACAINE 0.25 % ON-Q PUMP DUAL CATH 400 ML
400.0000 mL | INJECTION | Status: DC
  Filled 2024-09-29: qty 400

## 2024-09-29 MED ORDER — BUPIVACAINE HCL (PF) 0.5 % IJ SOLN
INTRAMUSCULAR | Status: DC | PRN
  Administered 2024-09-29: 30 mL

## 2024-09-29 MED ORDER — OXYTOCIN-SODIUM CHLORIDE 30-0.9 UT/500ML-% IV SOLN
2.5000 [IU]/h | INTRAVENOUS | Status: DC
  Administered 2024-09-29 (×2): 2.5 [IU]/h via INTRAVENOUS

## 2024-09-29 MED ORDER — PHENYLEPHRINE 80 MCG/ML (10ML) SYRINGE FOR IV PUSH (FOR BLOOD PRESSURE SUPPORT)
PREFILLED_SYRINGE | INTRAVENOUS | Status: DC | PRN
  Administered 2024-09-29 (×2): 80 ug via INTRAVENOUS

## 2024-09-29 MED ORDER — COCONUT OIL OIL: 1.0000 | TOPICAL_OIL | Status: DC | PRN

## 2024-09-29 MED ORDER — SENNOSIDES-DOCUSATE SODIUM 8.6-50 MG PO TABS
2.0000 | ORAL_TABLET | ORAL | Status: DC
  Administered 2024-09-29 – 2024-09-30 (×2): 2 via ORAL
  Filled 2024-09-29 (×2): qty 2

## 2024-09-29 MED ORDER — PHENYLEPHRINE HCL-NACL 20-0.9 MG/250ML-% IV SOLN
INTRAVENOUS | Status: DC | PRN
  Administered 2024-09-29: 80 ug/min via INTRAVENOUS

## 2024-09-29 MED ORDER — OXYCODONE HCL 5 MG PO TABS
5.0000 mg | ORAL_TABLET | Freq: Four times a day (QID) | ORAL | Status: DC | PRN
  Administered 2024-09-29 – 2024-10-01 (×4): 5 mg via ORAL
  Filled 2024-09-29 (×4): qty 1

## 2024-09-29 MED ORDER — CALCIUM CARBONATE ANTACID 500 MG PO CHEW: 2.0000 | CHEWABLE_TABLET | ORAL | Status: DC | PRN

## 2024-09-29 MED ORDER — METHYLERGONOVINE MALEATE 0.2 MG/ML IJ SOLN
INTRAMUSCULAR | Status: AC
  Filled 2024-09-29: qty 1

## 2024-09-29 MED ORDER — CEFAZOLIN SODIUM-DEXTROSE 2-4 GM/100ML-% IV SOLN
INTRAVENOUS | Status: AC
  Filled 2024-09-29: qty 100

## 2024-09-29 MED ORDER — TERBUTALINE SULFATE 1 MG/ML IJ SOLN
0.2500 mg | INTRAMUSCULAR | Status: AC
  Administered 2024-09-29: 0.25 mg via SUBCUTANEOUS
  Filled 2024-09-29: qty 1

## 2024-09-29 MED ORDER — WITCH HAZEL-GLYCERIN EX PADS: 1.0000 | MEDICATED_PAD | CUTANEOUS | Status: DC | PRN

## 2024-09-29 MED ORDER — DIPHENHYDRAMINE HCL 25 MG PO CAPS: 25.0000 mg | ORAL_CAPSULE | ORAL | Status: DC | PRN

## 2024-09-29 MED ORDER — TERBUTALINE SULFATE 1 MG/ML IJ SOLN
0.2500 mg | INTRAMUSCULAR | Status: AC
  Administered 2024-09-29: 0.25 mg via SUBCUTANEOUS

## 2024-09-29 MED ORDER — ACETAMINOPHEN 325 MG PO TABS: 650.0000 mg | ORAL_TABLET | ORAL | Status: DC | PRN

## 2024-09-29 MED ORDER — SOD CITRATE-CITRIC ACID 500-334 MG/5ML PO SOLN
30.0000 mL | ORAL | Status: AC
  Administered 2024-09-29: 30 mL via ORAL

## 2024-09-29 MED ORDER — TERBUTALINE SULFATE 1 MG/ML IJ SOLN
INTRAMUSCULAR | Status: AC
  Filled 2024-09-29: qty 1

## 2024-09-29 MED ORDER — DEXAMETHASONE SOD PHOSPHATE PF 10 MG/ML IJ SOLN
INTRAMUSCULAR | Status: DC | PRN
Start: 2024-09-29 — End: 2024-09-29
  Administered 2024-09-29: 10 mg via INTRAVENOUS

## 2024-09-29 MED ORDER — EPHEDRINE 5 MG/ML INJ
INTRAVENOUS | Status: AC
  Filled 2024-09-29: qty 5

## 2024-09-29 MED ORDER — PHENYLEPHRINE HCL-NACL 20-0.9 MG/250ML-% IV SOLN
INTRAVENOUS | Status: AC
  Filled 2024-09-29: qty 250

## 2024-09-29 MED ORDER — NALOXONE HCL 0.4 MG/ML IJ SOLN: 0.4000 mg | INTRAMUSCULAR | Status: DC | PRN

## 2024-09-29 MED ORDER — IBUPROFEN 600 MG PO TABS
600.0000 mg | ORAL_TABLET | Freq: Four times a day (QID) | ORAL | Status: DC
  Administered 2024-09-30 – 2024-10-01 (×4): 600 mg via ORAL
  Filled 2024-09-29 (×4): qty 1

## 2024-09-29 MED ORDER — ACETAMINOPHEN 500 MG PO TABS
1000.0000 mg | ORAL_TABLET | Freq: Four times a day (QID) | ORAL | Status: AC
  Administered 2024-09-29 – 2024-09-30 (×4): 1000 mg via ORAL
  Filled 2024-09-29 (×4): qty 2

## 2024-09-29 MED ORDER — NALOXONE HCL 4 MG/10ML IJ SOLN: 1.0000 ug/kg/h | INTRAVENOUS | Status: DC | PRN

## 2024-09-29 MED ORDER — SCOPOLAMINE 1 MG/3DAYS TD PT72: 1.0000 | MEDICATED_PATCH | Freq: Once | TRANSDERMAL | Status: DC

## 2024-09-29 MED ORDER — PRENATAL MULTIVITAMIN CH
1.0000 | ORAL_TABLET | Freq: Every day | ORAL | Status: DC
  Administered 2024-09-30 – 2024-10-01 (×2): 1 via ORAL
  Filled 2024-09-29 (×3): qty 1

## 2024-09-29 MED ORDER — OXYTOCIN-SODIUM CHLORIDE 30-0.9 UT/500ML-% IV SOLN
INTRAVENOUS | Status: DC | PRN
  Administered 2024-09-29: 300 mL/h via INTRAVENOUS

## 2024-09-29 MED ORDER — BUPIVACAINE IN DEXTROSE 0.75-8.25 % IT SOLN
INTRATHECAL | Status: DC | PRN
Start: 2024-09-29 — End: 2024-09-29
  Administered 2024-09-29: 1.5 mL via INTRATHECAL

## 2024-09-29 MED ORDER — FENTANYL CITRATE (PF) 100 MCG/2ML IJ SOLN
50.0000 ug | INTRAMUSCULAR | Status: DC | PRN
  Administered 2024-09-29: 100 ug via INTRAVENOUS
  Filled 2024-09-29 (×2): qty 2

## 2024-09-29 MED ORDER — PHENYLEPHRINE 80 MCG/ML (10ML) SYRINGE FOR IV PUSH (FOR BLOOD PRESSURE SUPPORT)
PREFILLED_SYRINGE | INTRAVENOUS | Status: AC
  Filled 2024-09-29: qty 10

## 2024-09-29 MED ORDER — EPHEDRINE SULFATE-NACL 50-0.9 MG/10ML-% IV SOSY
PREFILLED_SYRINGE | INTRAVENOUS | Status: DC | PRN
  Administered 2024-09-29: 10 mg via INTRAVENOUS

## 2024-09-29 MED ORDER — SODIUM CHLORIDE 0.9% FLUSH: 3.0000 mL | INTRAVENOUS | Status: DC | PRN

## 2024-09-29 MED ORDER — FENTANYL CITRATE (PF) 100 MCG/2ML IJ SOLN
25.0000 ug | Freq: Once | INTRAMUSCULAR | Status: AC
  Administered 2024-09-29: 25 ug via INTRAVENOUS

## 2024-09-29 MED ORDER — OXYCODONE-ACETAMINOPHEN 5-325 MG PO TABS
1.0000 | ORAL_TABLET | ORAL | Status: DC | PRN
  Administered 2024-10-01 (×2): 1 via ORAL
  Filled 2024-09-29 (×2): qty 1

## 2024-09-29 SURGICAL SUPPLY — 33 items
BENZOIN TINCTURE PRP APPL 2/3 (GAUZE/BANDAGES/DRESSINGS) ×1 IMPLANT
CATH KIT ON-Q SILVERSOAK 5 (CATHETERS) ×2 IMPLANT
DERMABOND ADVANCED .7 DNX12 (GAUZE/BANDAGES/DRESSINGS) ×1 IMPLANT
DRSG OPSITE POSTOP 4X10 (GAUZE/BANDAGES/DRESSINGS) ×1 IMPLANT
DRSG TEGADERM 4X4.75 (GAUZE/BANDAGES/DRESSINGS) IMPLANT
DRSG TELFA 3X8 NADH STRL (GAUZE/BANDAGES/DRESSINGS) ×1 IMPLANT
ELECT CAUTERY BLADE 6.4 (BLADE) ×1 IMPLANT
ELECTRODE REM PT RTRN 9FT ADLT (ELECTROSURGICAL) ×1 IMPLANT
GAUZE SPONGE 4X4 12PLY STRL (GAUZE/BANDAGES/DRESSINGS) ×1 IMPLANT
GLOVE BIO SURGEON STRL SZ7 (GLOVE) ×1 IMPLANT
GLOVE BIOGEL M STRL SZ7.5 (GLOVE) IMPLANT
GLOVE INDICATOR 7.5 STRL GRN (GLOVE) ×1 IMPLANT
GLOVE SURG SYN 6.5 PF PI BL (GLOVE) IMPLANT
GOWN STRL REUS W/ TWL LRG LVL3 (GOWN DISPOSABLE) ×3 IMPLANT
MANIFOLD NEPTUNE II (INSTRUMENTS) ×1 IMPLANT
MAT PREVALON FULL STRYKER (MISCELLANEOUS) ×1 IMPLANT
PACK C SECTION AR (MISCELLANEOUS) ×1 IMPLANT
PAD OB MATERNITY 11 LF (PERSONAL CARE ITEMS) ×2 IMPLANT
PAD PREP OB/GYN DISP 24X41 (PERSONAL CARE ITEMS) ×1 IMPLANT
SCRUB CHG 4% DYNA-HEX 4OZ (MISCELLANEOUS) ×1 IMPLANT
SOLN 0.9% NACL POUR BTL 1000ML (IV SOLUTION) ×1 IMPLANT
STAPLER INSORB 30 2030 C-SECTI (MISCELLANEOUS) IMPLANT
STAPLER SKIN PROX 35W (STAPLE) IMPLANT
STRIP CLOSURE SKIN 1/2X4 (GAUZE/BANDAGES/DRESSINGS) ×1 IMPLANT
SUT PDS AB 1 CTX 36 (SUTURE) IMPLANT
SUT PDS AB 1 TP1 96 (SUTURE) ×1 IMPLANT
SUT VIC AB 0 CTX36XBRD ANBCTRL (SUTURE) ×2 IMPLANT
SUT VIC AB 3-0 SH 27X BRD (SUTURE) IMPLANT
SUT VICRYL 3-0 (SUTURE) IMPLANT
SUTURE MNCRL 4-0 27XMF (SUTURE) ×1 IMPLANT
SYR 30ML LL (SYRINGE) IMPLANT
TRAP FLUID SMOKE EVACUATOR (MISCELLANEOUS) ×1 IMPLANT
WATER STERILE IRR 500ML POUR (IV SOLUTION) ×1 IMPLANT

## 2024-09-29 NOTE — Anesthesia Preprocedure Evaluation (Signed)
 Anesthesia Evaluation  Patient identified by MRN, date of birth, ID band Patient awake    Reviewed: Allergy & Precautions, NPO status , Patient's Chart, lab work & pertinent test results  History of Anesthesia Complications Negative for: history of anesthetic complications  Airway Mallampati: III  TM Distance: >3 FB Neck ROM: full    Dental  (+) Chipped   Pulmonary neg pulmonary ROS   Pulmonary exam normal        Cardiovascular (-) angina (-) CAD and (-) Past MI Normal cardiovascular exam+ dysrhythmias      Neuro/Psych  PSYCHIATRIC DISORDERS      negative neurological ROS     GI/Hepatic Neg liver ROS,GERD  ,,  Endo/Other  negative endocrine ROS    Renal/GU      Musculoskeletal   Abdominal   Peds  Hematology negative hematology ROS (+)   Anesthesia Other Findings PMH of RBBB and scoliosis. Patient had an epidural for her last baby which ended up going to a c section and her epidural wasn't sufficient and she ended up going to sleep with an ETT. Discussed that if a spinal can't be placed that the back up is GAETT. Patient stated she understood and is willing to proceed.   Past Medical History: No date: EKG abnormalities No date: PCOS (polycystic ovarian syndrome) 2017: Right bundle branch block No date: Scoliosis  Past Surgical History: 04/08/2022: CESAREAN SECTION     Comment:  Procedure: CESAREAN SECTION;  Surgeon: Connell Davies,               MD;  Location: [facility] ORS;  Service: Obstetrics;; No date: ESOPHAGUS SURGERY     Comment:  foreign object removed  BMI    Body Mass Index: 33.87 kg/m      Reproductive/Obstetrics (+) Pregnancy                              Anesthesia Physical Anesthesia Plan  ASA: 2  Anesthesia Plan: General/Spinal   Post-op Pain Management:    Induction: Intravenous  PONV Risk Score and Plan: 2 and Ondansetron , Dexamethasone , Propofol  infusion,  TIVA and Midazolam   Airway Management Planned: Natural Airway and Nasal Cannula  Additional Equipment:   Intra-op Plan:   Post-operative Plan:   Informed Consent: I have reviewed the patients History and Physical, chart, labs and discussed the procedure including the risks, benefits and alternatives for the proposed anesthesia with the patient or authorized representative who has indicated his/her understanding and acceptance.     Dental Advisory Given  Plan Discussed with: Anesthesiologist, CRNA and Surgeon  Anesthesia Plan Comments: (Patient reports no bleeding problems and no anticoagulant use.  Plan for spinal with backup GA  Patient consented for risks of anesthesia including but not limited to:  - adverse reactions to medications - damage to eyes, teeth, lips or other oral mucosa - nerve damage due to positioning  - risk of bleeding, infection and or nerve damage from spinal that could lead to paralysis - risk of headache or failed spinal - damage to teeth, lips or other oral mucosa - sore throat or hoarseness - damage to heart, brain, nerves, lungs, other parts of body or loss of life  Patient voiced understanding and assent.)        Anesthesia Quick Evaluation

## 2024-09-29 NOTE — Interval H&P Note (Signed)
 History and Physical Interval Note:  09/29/2024 12:35 PM  Holly Wu  has presented today for surgery, with the diagnosis of prior cesarean.  The various methods of treatment have been discussed with the patient and family. After consideration of risks, benefits and other options for treatment, the patient has consented to  Procedure(s) with comments: CESAREAN DELIVERY (N/A) - REPEAT as a surgical intervention.  The patient's history has been reviewed, patient examined, no change in status, stable for surgery.  I have reviewed the patient's chart and labs.  Questions were answered to the patient's satisfaction.    Patient presented with contractions today, as well. Will move, as scheduled, to the OR.  Garnette Mace, MD, [facility] 09/29/2024 12:36 PM

## 2024-09-29 NOTE — Transfer of Care (Signed)
 Immediate Anesthesia Transfer of Care Note  Patient: Holly Wu  Procedure(s) Performed: CESAREAN DELIVERY (Abdomen)  Patient Location: Mother/Baby  Anesthesia Type:Spinal  Level of Consciousness: awake, alert , and oriented  Airway & Oxygen Therapy: Patient Spontanous Breathing  Post-op Assessment: Report given to RN and Post -op Vital signs reviewed and stable  Post vital signs: Reviewed and stable  Last Vitals:  Vitals Value Taken Time  BP 108/66 1426  Temp 36.7 C 09/29/24 14:24  Pulse 89 1426  Resp 21 1426  SpO2 98 1426    Last Pain:  Vitals:   09/29/24 1424  TempSrc: Oral  PainSc:       Patients Stated Pain Goal: 0 (09/29/24 1113)  Complications: No notable events documented.

## 2024-09-29 NOTE — OB Triage Note (Signed)
 Pt presented to [facility] triage for labor check; patient is also a scheduled c/s for 1200. Pt stated the contractions started around 2300 last night and is rating her pain a 6/10. Patient states she last had something to eat and drink at 2340 on 09/28/24. SVE performed and D. Tanda, CNM called and notified

## 2024-09-29 NOTE — Op Note (Signed)
 Cesarean Section Operative Note    Patient Name: Holly Wu  Date of Birth: 26-Aug-1996  MRN: [id number]  Date of Surgery: 09/29/2024   Pre-operative Diagnosis:  1) History of cesarean delivery, desires repeat 2) intrauterine pregnancy at [redacted]w[redacted]d    Post-operative Diagnosis:  1) History of cesarean delivery, desires repeat 2) intrauterine pregnancy at [redacted]w[redacted]d    Procedure: Repeat low transverse cesarean section via Pfannenstiel incision  Surgeon: Surgeons and Role:    DEWAINE Leonce Garnette JONETTA, MD - Primary   Assistants: Marcelline Coe, CNM; No other capable assistant available, in surgery requiring high level assistant.  Anesthesia: spinal   Findings:  1) normal appearing gravid uterus, fallopian tubes, and ovaries 2) viable female infant with weight of 3,380 grams, APGARs 8 and 9.    Quantified Blood Loss: 470 mL  Total IV Fluids: 700 ml   Urine Output: 200 mL  Specimens: none  Complications: no complications  Disposition: PACU - hemodynamically stable.   Maternal Condition: stable   Baby condition / location:  Couplet care / Skin to Skin  Procedure Details:  The patient was seen in the Holding Room. The risks, benefits, complications, treatment options, and expected outcomes were discussed with the patient. The patient concurred with the proposed plan, giving informed consent. identified as Holly Wu and the procedure verified as C-Section Delivery. A Time Out was held and the above information confirmed.   After induction of anesthesia, the patient was draped and prepped in the usual sterile manner. A Pfannenstiel incision was made and carried down through the subcutaneous tissue to the fascia. Fascial incision was made and extended transversely. The fascia was separated from the underlying rectus tissue superiorly and inferiorly. The peritoneum was identified and entered. Peritoneal incision was extended longitudinally. The bladder flap was not bluntly or  sharply freed from the lower uterine segment. A low transverse uterine incision was made and the hysterotomy was extended with cranial-caudal tension. Delivered from cephalic presentation was a 3,380 gram Living newborn infant(s) or Female with Apgar scores of 8 at one minute and 9 at five minutes. Cord ph was not sent the umbilical cord was clamped and cut cord blood was not obtained for evaluation. The placenta was removed Intact and appeared normal. The uterine outline, tubes and ovaries appeared normal. The uterine incision was closed with running locked sutures of 0 Vicryl.  A second layer of the same suture was thrown in an imbricating fashion.  Hemostasis was assured.  The uterus was returned to the abdomen and the paracolic gutters were cleared of all clots and debris.  The rectus muscles were inspected and found to be hemostatic.  The On-Q catheter pumps were inserted in accordance with the manufacturer's recommendations.  The catheters were inserted approximately 4cm cephelad to the incision line, approximately 1cm apart, straddling the midline.  They were inserted to a depth of the 4th mark. They were positioned superficial to the rectus abdominus muscles and deep to the rectus fascia.    The fascia was then reapproximated with running sutures of 1-0 PDS, looped. The subcuticular closure was performed using 4-0 monocryl. The skin closure was reinforced using surgical skin glue.  The On-Q catheters were bolused with 5 mL of 0.5% marcaine  plain for a total of 10 mL.  The catheters were affixed to the skin with surgical skin glue, steri-strips, and tegaderm.    The surgical assistant performed tissue retraction, assistance with suturing, and fundal pressure.  Instrument, sponge, and needle counts were  correct prior the abdominal closure and were correct at the conclusion of the case.  The patient received Ancef  2 gram IV prior to skin incision (within 30 minutes). For VTE prophylaxis she was  wearing SCDs throughout the case.  The assistant surgeon was a CNM due to lack of availability of another sales promotion account executive.    Signed: Garnette CHARM Mace, MD 09/29/2024 2:25 PM

## 2024-09-29 NOTE — Anesthesia Procedure Notes (Signed)
 Spinal  Patient location during procedure: OB Start time: 09/29/2024 12:46 PM End time: 09/29/2024 12:55 PM Reason for block: surgical anesthesia Staffing Performed: resident/CRNA  Resident/CRNA: Jackye Spanner, CRNA Performed by: Jackye Spanner, CRNA Authorized by: Leavy Ned, MD   Preanesthetic Checklist Completed: patient identified, IV checked, site marked, risks and benefits discussed, surgical consent, monitors and equipment checked, pre-op evaluation and timeout performed Spinal Block Patient position: sitting Prep: ChloraPrep Patient monitoring: heart rate, continuous pulse ox and blood pressure Approach: midline Location: L3-4 Injection technique: single-shot Needle Needle type: Whitacre and Introducer  Needle gauge: 24 G Needle length: 10 cm Assessment Sensory level: T4 Events: CSF return Additional Notes Negative paresthesia. Negative blood return. Positive free-flowing CSF. Expiration date of kit checked and confirmed. Patient tolerated procedure well, without complications. Successful on first attempt, no complications noted. Pt. Tolerated Spinal injection very well.

## 2024-09-29 NOTE — Consult Note (Signed)
 Initial Consultation Note   Patient: Holly Wu FMW:[id number] DOB: [birth year] PCP: Marny Mari, MD DOA: 09/29/2024 DOS: the patient was seen and examined on 09/29/2024 Primary service: Leonce Garnette BIRCH, MD  Referring physician: Midwife Delon Coe  Reason for consult: Ventricular bigeminy with PVCs incidentally seen on telemetry  Assessment/Plan: PVCs - PVCs identified on telemetry and seen again on 12 lead EKG. At the time of my evaluation all PVCs have resolved and patient is NSR on telemetry monitor.  I have requested for repeat EKG. - Patient is asymptomatic, denies palpitations, chest pain. - Denies history of heavy alcohol use, or illicit substances.  She does endorse high caffeine intake. - Patient endorses history of palpitations which have been evaluated by cardiologist before but it has been over 10 years.  Per her description I do suspect she has high PVC burden and would probably benefit from beta-blocker therapy. - For now PVCs are likely provoked by recent abdominal surgery, stress, and subsequent electrolyte and CBC abnormalities. -- Recommend rehydrating patient, correct electrolytes as needed - Repeat CBC and CMP in a.m. - Presently all vitals remain within normal limits.  Blood pressure very stable. - If patient begins experiencing chest pain or begins having vital abnormalities consider cardiology consultation. - Would benefit from cardiology referral at discharge to better assess her PVC burden and discuss medication initiation.  Right bundle branch block - This is a chronic problem identified on multiple prior EKGs. Pt reports she has had this since 28yo - Consider outpatient follow-up with cards for continued monitoring  Postpartum day 0 - Status post repeat C-section today.  Will defer management to [facility] team.    [facility] will continue to follow the patient.  HPI: Holly Wu is a 28 y.o. female with past medical history of right bundle  branch block, who presented to [facility] today for repeat C-section at 39 weeks 0 days.  Reportedly C-section was uneventful.  Patient was kept on telemetry postoperatively, and she was noted by telemetry team to have ventricular bigeminy with PVCs.  TRH was consulted for further evaluation. At the time my evaluation patient denies any chest pain.  She reports lifelong history of palpitations.  She reports that she had them thoroughly evaluated when she was a teenager including a Holter monitor but has not seen a physician in many years for this.  She reports multiple runs of palpitations on a daily basis.  She denies any active chest pain.  At the time my evaluation patient is on telemetry monitoring and is no longer demonstrating PVCs.  Review of Systems: As mentioned in the history of present illness. All other systems reviewed and are negative. Past Medical History:  Diagnosis Date   Anemia    EKG abnormalities    Heart murmur    PCOS (polycystic ovarian syndrome)    Right bundle branch block 2017   Scoliosis    Past Surgical History:  Procedure Laterality Date   CESAREAN SECTION  04/08/2022   Procedure: CESAREAN SECTION;  Surgeon: Connell Davies, MD;  Location: [facility] ORS;  Service: Obstetrics;;   CHOLECYSTECTOMY  2023   ESOPHAGUS SURGERY     foreign object removed   Social History:  reports that she has never smoked. She has never used smokeless tobacco. She reports that she does not drink alcohol and does not use drugs.  Allergies  Allergen Reactions   Toradol  [Ketorolac  Tromethamine ] Rash   Zofran  [Ondansetron ] Rash    Family History  Problem Relation Age of  Onset   Diabetes Mother    Healthy Father    Colon cancer Maternal Grandfather    Breast cancer Paternal Grandmother    Colon cancer Maternal Aunt    Breast cancer Other 60    Prior to Admission medications   Medication Sig Start Date End Date Taking? Authorizing Provider  famotidine (PEPCID) 40 MG tablet Take 1 tablet (40  mg total) by mouth every evening. 09/19/24 10/19/24 Yes Myron Nest, CNM  Prenatal Vit-Fe Fumarate-FA (MULTIVITAMIN-PRENATAL) 27-0.8 MG TABS tablet Take 1 tablet by mouth daily at 12 noon.   Yes [provider]  acetaminophen  (TYLENOL ) 500 MG tablet Take 2 tablets (1,000 mg total) by mouth every 6 (six) hours. Patient not taking: Reported on 09/19/2024 04/11/22   Delinda Jinnie Jansky, CNM    Physical Exam: Vitals:   09/29/24 1634 09/29/24 1635 09/29/24 1636 09/29/24 1637  BP:      Pulse: 77 81 81 86  Resp: (!) 21 19 16 15   Temp:      TempSrc:      SpO2: 96% 95% 96% 94%  Weight:      Height:       Constitutional:  Normal appearance. Non toxic-appearing.  Mildly diaphoretic HENT: Head Normocephalic and atraumatic.  Mucous membranes are moist.  Eyes:  Extraocular intact. Conjunctivae normal. Pupils are equal, round, and reactive to light.  Cardiovascular: Rate and Rhythm: Normal rate and regular rhythm.  Pulmonary: Non labored, symmetric rise of chest wall.  Neurological: No focal deficit present. alert. Oriented. Psychiatric: Mood and Affect congruent.   Data Reviewed:      Latest Ref Rng & Units 09/28/2024   10:13 AM 09/19/2024    1:08 PM 06/13/2022    4:37 AM  CBC  WBC 4.0 - 10.5 K/uL 13.4  11.3  4.8   Hemoglobin 12.0 - 15.0 g/dL 89.7  89.2  88.3   Hematocrit 36.0 - 46.0 % 33.2  34.4  38.0   Platelets 150 - 400 K/uL 211  201  179       Latest Ref Rng & Units 09/19/2024    1:08 PM 06/13/2022    4:37 AM 06/12/2022    6:17 AM  BMP  Glucose 70 - 99 mg/dL 891  82  94   BUN 6 - 20 mg/dL 10  11  15    Creatinine 0.44 - 1.00 mg/dL 9.31  9.21  9.18   Sodium 135 - 145 mmol/L 134  141  141   Potassium 3.5 - 5.1 mmol/L 4.0  4.1  3.9   Chloride 98 - 111 mmol/L 101  114  108   CO2 22 - 32 mmol/L 23  23  26    Calcium 8.9 - 10.3 mg/dL 8.6  8.1  9.0     Family Communication: At bedside  Primary team communication: I have updated the midwife on call.  Thank you very much for  involving us  in the care of your patient.  Author: Garlan Drewes, DO 09/29/2024 5:57 PM  For on call review [email or web address].

## 2024-09-30 ENCOUNTER — Encounter: Payer: Self-pay | Admitting: Obstetrics and Gynecology

## 2024-09-30 DIAGNOSIS — O9081 Anemia of the puerperium: Secondary | ICD-10-CM | POA: Diagnosis not present

## 2024-09-30 DIAGNOSIS — O479 False labor, unspecified: Secondary | ICD-10-CM | POA: Diagnosis not present

## 2024-09-30 DIAGNOSIS — Z3A00 Weeks of gestation of pregnancy not specified: Secondary | ICD-10-CM | POA: Diagnosis not present

## 2024-09-30 LAB — COMPREHENSIVE METABOLIC PANEL WITH GFR
ALT: 8 U/L (ref 0–44)
AST: 22 U/L (ref 15–41)
Albumin: 2.7 g/dL — ABNORMAL LOW (ref 3.5–5.0)
Alkaline Phosphatase: 143 U/L — ABNORMAL HIGH (ref 38–126)
Anion gap: 8 (ref 5–15)
BUN: 8 mg/dL (ref 6–20)
CO2: 23 mmol/L (ref 22–32)
Calcium: 8.2 mg/dL — ABNORMAL LOW (ref 8.9–10.3)
Chloride: 106 mmol/L (ref 98–111)
Creatinine, Ser: 0.68 mg/dL (ref 0.44–1.00)
GFR, Estimated: >60 mL/min (ref >60)
Glucose, Bld: 89 mg/dL (ref 70–99)
Potassium: 4.5 mmol/L (ref 3.5–5.1)
Sodium: 137 mmol/L (ref 135–145)
Total Bilirubin: <0.2 mg/dL (ref 0.0–1.2)
Total Protein: 5.7 g/dL — ABNORMAL LOW (ref 6.5–8.1)

## 2024-09-30 LAB — CBC
HCT: 30.8 % — ABNORMAL LOW (ref 36.0–46.0)
Hemoglobin: 9.7 g/dL — ABNORMAL LOW (ref 12.0–15.0)
MCH: 24.6 pg — ABNORMAL LOW (ref 26.0–34.0)
MCHC: 31.5 g/dL (ref 30.0–36.0)
MCV: 78 fL — ABNORMAL LOW (ref 80.0–100.0)
Platelets: 198 K/uL (ref 150–400)
RBC: 3.95 MIL/uL (ref 3.87–5.11)
RDW: 15.3 % (ref 11.5–15.5)
WBC: 29.2 K/uL — ABNORMAL HIGH (ref 4.0–10.5)
nRBC: 0 % (ref 0.0–0.2)

## 2024-09-30 NOTE — Progress Notes (Signed)
 Postop Day  1  Subjective: 28 y.o. G2P2002 postpartum day #1 status post repeat cesarean section. She is ambulating, is tolerating po, is voiding spontaneously.  Her pain is well controlled on PO pain medications. Her lochia is less than menses.  Objective: BP 127/86   Pulse (!) 57   Temp 97.7 F (36.5 C) (Oral)   Resp 20   Ht 5' 2 (1.575 m)   Wt 86.2 kg   LMP 12/31/2023   SpO2 98%   Breastfeeding Unknown   BMI 34.75 kg/m    Physical Exam:  General: alert, cooperative, and appears stated age Breasts: soft/nontender Pulm: nl effort Abdomen: soft, non-tender, active bowel sounds Uterine Fundus: firm Incision: healing well, no significant drainage, no dehiscence, no significant erythema Perineum: no edema, intact Lochia: appropriate DVT Evaluation: No evidence of DVT seen on physical exam. Negative Homan's sign. No cords or calf tenderness. No significant calf/ankle edema.  Recent Labs    09/28/24 1013 09/30/24 0741  HGB 10.2* 9.7*  HCT 33.2* 30.8*  WBC 13.4* 29.2*  PLT 211 198    Assessment/Plan: 28 y.o. G2P2002 Postop Day  1  1. Continue routine postpartum care  2. Infant feeding status: formula feeding --Lactation consult PRN for breastfeeding  --Encouraged snug fitting bra, cold application, Tylenol  PRN, and cabbage leaves for engorgement for formula feeding   3. Contraception plan: IUD, Nexplanon  4. Acute blood loss anemia - clinically significant.  --Hemodynamically stable and asymptomatic --Intervention: continue on oral supplementation with ferrous sulfate  325  5. Immunization status:   needs Flu prior to discharge    Disposition: plan for discharge home tomorrow    LOS: 1 day   Danne Vasek, CNM 09/30/2024, 2:03 PM   ----- Bobbette Brunswick Certified Nurse Midwife [facility]

## 2024-09-30 NOTE — Anesthesia Postprocedure Evaluation (Signed)
 Anesthesia Post Note  Patient: Holly Wu  Procedure(s) Performed: CESAREAN DELIVERY (Abdomen)  Patient location during evaluation: Mother Baby Anesthesia Type: Combined General/Spinal Level of consciousness: awake and alert and oriented Pain management: satisfactory to patient Vital Signs Assessment: post-procedure vital signs reviewed and stable Respiratory status: spontaneous breathing Cardiovascular status: stable Postop Assessment: patient able to bend at knees, no apparent nausea or vomiting, adequate PO intake and able to ambulate Anesthetic complications: no Comments: Has been able to void.   No notable events documented.   Last Vitals:  Vitals:   09/29/24 2321 09/30/24 0309  BP: 117/80 110/66  Pulse: (!) 59 79  Resp: 18 16  Temp:  36.7 C  SpO2: 100% 95%    Last Pain:  Vitals:   09/30/24 0512  TempSrc:   PainSc: 4                  Knoah Nedeau Dyane

## 2024-09-30 NOTE — Clinical Social Work Maternal (Signed)
 CLINICAL SOCIAL WORK MATERNAL/CHILD NOTE  Patient Details  Name: Holly Wu MRN: [id number] Date of Birth: 03/31/1996  Date:  2023/12/30  Clinical Social Worker Initiating Note:  Corrie Wu Date/Time: Initiated:  09/30/24/0930     Child's Name:  Holly Wu [facility]   Biological Parents:  Mother, Father (FOB was in the room but sleeping during consult)   Need for Interpreter:  None   Reason for Referral:  Late or No Prenatal Care     Address:  [address] [place] [address]    Phone number:  [phone] (home) [phone] (work)    Additional phone number:   Household Members/Support Persons (HM/SP):   Household Member/Support Person 1   HM/SP Name Relationship DOB or Age  HM/SP -1 Holly Wu FOB 28  HM/SP -2  Holly Wu   Patient daughter  2  HM/SP -3        HM/SP -4        HM/SP -5        HM/SP -6        HM/SP -7        HM/SP -8          Natural Supports (not living in the home):  Immediate Family, Extended Family   Professional Supports: None   Employment: Unemployed   Type of Work:     Education:  Engineer, agricultural   Homebound arranged:    Surveyor, Quantity Resources:  Medicaid   Other Resources:  Sales Executive     Cultural/Religious Considerations Which May Impact Care:    Strengths:  Ability to meet basic needs  , Compliance with medical plan  , Home prepared for child  , Pediatrician chosen   Psychotropic Medications:         Pediatrician:    [employer] [employer]  Pediatrician List:   [facility]    [place]    [facility]  [facility]      Pediatrician Fax Number:    Risk Factors/Current Problems:  Other (Comment) (Late prenatal care)   Cognitive State:  Alert     Mood/Affect:  Calm  , Relaxed     CSW Assessment:  Chart reviewed. I received a consult for late prenatal care. I was able to speak with the patient and the father of the baby at  bedside today. The father of the baby was asleep during the consult. I introduced myself, my role, and reason for consult.  The patient reports that she was doing better after delievery. The patient confirmed that her address is [address] [place] [address] and telephone number is [phone].   The patient reports that the father of the baby is Holly Wu. The patient reports that she lives in the home with the FOB and daughter. The patient reports that she has support from her parents and sister. The patient reports that she receives food stamps and will apply to [facility]. The patient reports that she has no mental health history and is not active in any anything therapy.   The patient reports that she has no past or current SI/HI/DV. The patient reports that she does not have a PCP. SW offered PCP resources to AVS. The patient was accepting. The patient reports that the baby will go to [facility] peds for medical appointments. The patient reports that she will not breast feed. The patient reports that she has a crib, car  seat, diapers, clothes and pack and play for the baby.   I reviewed information on Post partum depression, Sudden infant death syndrome, car seat safety, safe sleep environment, mental health resources, prenatal mood and anxiety disorder, and post partum community resources. The patient  verbalized understanding.   The patient reports that the FOB will assist her during D/C .  I have added PCP resources to patient AVS.   CSW Plan/Description:  Sudden Infant Death Syndrome (SIDS) Education, Perinatal Mood and Anxiety Disorder (PMADs) Education, Other Information/Referral to [facility]    Holly JINNY Ruts, LCSW 19-Feb-2024, 1:02 PM

## 2024-09-30 NOTE — Progress Notes (Signed)
 PROGRESS NOTE    Holly Wu  FMW:[id number] DOB: 1996/05/18 DOA: 09/29/2024 PCP: Marny Mari, MD  No chief complaint on file.   Hospital Course:   Holly Wu is a 28 y.o. female with past medical history of right bundle branch block, who presented to [facility] today for repeat C-section at 39 weeks 0 days.  Reportedly C-section was uneventful.  Patient was kept on telemetry postoperatively, and she was noted by telemetry team to have ventricular bigeminy with PVCs.  TRH was consulted for further evaluation. At the time my evaluation patient denies any chest pain.  She reports lifelong history of palpitations.  She reports that she had them thoroughly evaluated when she was a teenager including a Holter monitor but has not seen a physician in many years for this.  She reports multiple runs of palpitations on a daily basis.  She denies any active chest pain.  At the time my initial evaluation patient is on telemetry monitoring and is no longer demonstrating PVCs. On repeat evaluation 11/13 patient reports no further episodes of palpitations.  She is amendable to cardiology follow-up outpatient and would like to have Zio patch arranged before she discharges.  Subjective: No acute events overnight.  Patient reports that she has not had any palpitations since the event yesterday.  We discussed potentially starting medication at this time versus assessing PVC burden with monitor.  She would like to get the monitor arranged with cardiology service prior to discharging from the hospital.  Objective: Vitals:   09/29/24 2100 09/29/24 2321 09/30/24 0309 09/30/24 0915  BP:  117/80 110/66 127/86  Pulse:  (!) 59 79 (!) 57  Resp:  18 16 20   Temp:   98.1 F (36.7 C) 97.7 F (36.5 C)  TempSrc:   Oral Oral  SpO2: 95% 100% 95% 98%  Weight:      Height:        Intake/Output Summary (Last 24 hours) at 09/30/2024 1627 Last data filed at 09/30/2024 1100 Gross per 24 hour  Intake --   Output 3510 ml  Net -3510 ml   Filed Weights   09/29/24 0728  Weight: 86.2 kg    Examination: General exam: Appears calm and comfortable, NAD  Respiratory system: No work of breathing, symmetric chest wall expansion Cardiovascular system: S1 & S2 heard, RRR.  Skin: No rashes, lesions Psychiatry: Demonstrates appropriate judgement and insight. Mood & affect appropriate for situation.   Assessment & Plan:  Principal Problem:   Uterine contractions Active Problems:   No prenatal care in current pregnancy in third trimester   History of cesarean delivery   [redacted] weeks gestation of pregnancy   PVCs - PVCs identified as ventricular bigeminy on telemetry, seen again on twelve-lead EKG 11/12.  Quickly self resolved - No history heavy alcohol use or illicit substances.  High caffeine use. - Has extensive history of palpitations and has previously been evaluated by cardiology but it has been over 2 years. - By patient reports she likely carries high PVC burden. - Have consulted with cardiology and they will attach Zio patch tomorrow before the patient discharges to assess PVC burden and follow-up outpatient to discuss initiation of medication. - For now PVCs are likely provoked by recent abdominal surgery, stress, and subsequent electrolyte and CBC abnormalities. -- Electrolytes today appear stable. - All vital signs remained within normal limits, blood pressure very stable. - Patient continues to deny any chest pain.   Right bundle branch block - This is a  chronic problem identified on multiple prior EKGs. Pt reports she has had this since 28yo - Consider outpatient follow-up with cards for continued monitoring   Postpartum day 1 - Status post repeat C-section 11/12 - Will defer OB management to OB team  TRH will sign off.  Feel free to reconsult us  if needed.      Antimicrobials:  Anti-infectives (From admission, onward)    Start     Dose/Rate Route Frequency Ordered Stop    09/29/24 0809  ceFAZolin  (ANCEF ) IVPB 2g/100 mL premix        2 g 200 mL/hr over 30 Minutes Intravenous 30 min pre-op 09/29/24 0810 09/29/24 1316   09/29/24 0800  ceFAZolin  (ANCEF ) 2-4 GM/100ML-% IVPB       Note to Pharmacy: Dorien Sailors K: cabinet override      09/29/24 0800 09/29/24 1541       Data Reviewed: I have personally reviewed following labs and imaging studies CBC: Recent Labs  Lab 09/28/24 1013 09/30/24 0741  WBC 13.4* 29.2*  HGB 10.2* 9.7*  HCT 33.2* 30.8*  MCV 78.5* 78.0*  PLT 211 198   Basic Metabolic Panel: Recent Labs  Lab 09/30/24 0741  NA 137  K 4.5  CL 106  CO2 23  GLUCOSE 89  BUN 8  CREATININE 0.68  CALCIUM 8.2*   GFR: Estimated Creatinine Clearance: 108.5 mL/min (by C-G formula based on SCr of 0.68 mg/dL). Liver Function Tests: Recent Labs  Lab 09/30/24 0741  AST 22  ALT 8  ALKPHOS 143*  BILITOT <0.2  PROT 5.7*  ALBUMIN 2.7*   CBG: No results for input(s): GLUCAP in the last 168 hours.  No results found for this or any previous visit (from the past 240 hours).   Radiology Studies: No results found.  Scheduled Meds:  ferrous sulfate   325 mg Oral BID WC   ibuprofen   600 mg Oral Q6H   prenatal multivitamin  1 tablet Oral Q1200   scopolamine  1 patch Transdermal Once   senna-docusate  2 tablet Oral Q24H   simethicone   80 mg Oral TID PC   Continuous Infusions:  bupivacaine  0.25 % ON-Q pump DUAL CATH 400 mL     lactated ringers  125 mL/hr at 09/29/24 2346   naloxone  HCl (NARCAN ) 2 mg in dextrose  5 % 250 mL infusion       LOS: 1 day   Frisco Cordts, DO [facility]  To contact the attending physician between 7A-7P please use Epic Chat. To contact the covering physician during after hours 7P-7A, please review Amion.  09/30/2024, 4:27 PM   *This document has been created with the assistance of dictation software. Please excuse typographical errors. *

## 2024-09-30 NOTE — Discharge Instructions (Signed)
 Some PCP options in [place] area- not a comprehensive list  [facility]- [phone] [facility]- [phone] [facility]- [phone] [facility]- [phone] [facility]- [phone] Nichole Molly- [phone]  or [facility] Physician Referral Line [phone]

## 2024-10-01 LAB — CBC
HCT: 31.2 % — ABNORMAL LOW (ref 36.0–46.0)
Hemoglobin: 9.8 g/dL — ABNORMAL LOW (ref 12.0–15.0)
MCH: 24.6 pg — ABNORMAL LOW (ref 26.0–34.0)
MCHC: 31.4 g/dL (ref 30.0–36.0)
MCV: 78.4 fL — ABNORMAL LOW (ref 80.0–100.0)
Platelets: 202 K/uL (ref 150–400)
RBC: 3.98 MIL/uL (ref 3.87–5.11)
RDW: 15.7 % — ABNORMAL HIGH (ref 11.5–15.5)
WBC: 15.4 K/uL — ABNORMAL HIGH (ref 4.0–10.5)
nRBC: 0 % (ref 0.0–0.2)

## 2024-10-01 MED ORDER — SENNOSIDES-DOCUSATE SODIUM 8.6-50 MG PO TABS: 2.0000 | ORAL_TABLET | Freq: Every day | ORAL | Status: AC | PRN

## 2024-10-01 MED ORDER — OXYCODONE HCL 5 MG PO TABS: 5.0000 mg | ORAL_TABLET | Freq: Four times a day (QID) | ORAL | 0 refills | Status: AC | PRN

## 2024-10-01 MED ORDER — FERROUS SULFATE 325 (65 FE) MG PO TABS: 325.0000 mg | ORAL_TABLET | ORAL | Status: AC

## 2024-10-01 MED ORDER — SIMETHICONE 80 MG PO CHEW: 80.0000 mg | CHEWABLE_TABLET | Freq: Three times a day (TID) | ORAL | Status: AC

## 2024-10-01 MED ORDER — IBUPROFEN 600 MG PO TABS: 600.0000 mg | ORAL_TABLET | Freq: Four times a day (QID) | ORAL | 1 refills | Status: AC | PRN

## 2024-10-01 NOTE — Discharge Summary (Signed)
 Postpartum Discharge Summary  Patient Name: Holly Wu DOB: 05/12/1996 MRN: [id number]  Date of admission: 09/29/2024 Delivery date:09/29/2024 Delivering provider: JACKSON, STEPHEN D Date of discharge: 10/01/2024  Primary OB: [facility] at 37 weeks  -No prior prenatal care in pregnancy  OFE:Ejupzwu'd last menstrual period was 12/31/2023. EDC Estimated Date of Delivery: 10/06/24 Gestational Age at Delivery: [redacted]w[redacted]d   Admitting diagnosis: Uterine contractions [O47.9] History of cesarean delivery [Z98.891] Intrauterine pregnancy: [redacted]w[redacted]d     Secondary diagnosis:   Principal Problem:   Uterine contractions Active Problems:   History of right bundle branch block   No prenatal care in current pregnancy in third trimester   History of cesarean delivery   [redacted] weeks gestation of pregnancy   Cesarean delivery delivered  Discharge Diagnosis: Term Pregnancy Delivered      Hospital course: Sceduled C/S   28 y.o. yo G2P2002 at [redacted]w[redacted]d was admitted to the hospital 09/29/2024 for scheduled cesarean section with the following indication:Elective Repeat.Delivery details are as follows:  Membrane Rupture Time/Date: 1:21 PM,09/29/2024  Delivery Method:C-Section, Low Transverse Operative Delivery:N/A Details of operation can be found in separate operative note.  Patient had a postpartum course complicated by right bundle branch block. She has a known cardiac history and has been evaluated several times by cardiology. Inpatient consult with [facility] was completed after telemetry notified OB team about abnormal tele monitoring. Patient was asymptomatic during admission but reports a history of frequent palpations. [facility] provided patient with Zio Patch for 7 days of home monitoring. Will plan for follow up with cardiology postpartum.  She is ambulating, tolerating a regular diet, passing flatus, and urinating well. Patient is discharged home in stable condition on   10/01/24        Newborn Data: Birth date:09/29/2024 Birth time:1:22 PM Gender:Female Living status:Living Apgars:8 ,9  Weight:3380 g                                              Post partum procedures:None Augmentation:: N/A Complications: None Delivery Type: repeat cesarean section, low transverse incision Anesthesia: spinal anesthesia Placenta: manual removal To Pathology: No   Prenatal Labs:  Blood type/Rh A positive  Antibody screen negative  Rubella Not immune  Varicella Unknown    RPR NR  HBsAg negative  HIV negative on 09/19/2024  GC negative  Chlamydia negative  Genetic screening Not done  1 hour GTT N/a  3 hour GTT N/a  Hemoglobin a1c 5.9  GBS negative on 09/19/2024    Magnesium  Sulfate received: No BMZ received: No Rhophylac:was not indicated MMR: was declined  Varivax vaccine given: was not indicated Tdap vaccine: Not given - no prenatal care prior to 37 weeks  Flu vaccine: declined  RSV vaccine: not given - no prenatal care prior to 37 weeks   Transfusion:No  Physical exam  Vitals:   09/30/24 0915 09/30/24 1713 09/30/24 2300 10/01/24 0900  BP: 127/86 121/75 116/70 125/83  Pulse: (!) 57 74 90 63  Resp: 20 20 20 18   Temp: 97.7 F (36.5 C) 97.9 F (36.6 C) (!) 97.5 F (36.4 C) (!) 97.4 F (36.3 C)  TempSrc: Oral Oral Oral Oral  SpO2: 98% 98% 98% 100%  Weight:      Height:       General: alert, cooperative, and no distress Lochia: appropriate Uterine Fundus: firm Perineum:minimal edema/intact Incision: Healing well  with no significant drainage, Dressing is clean, dry, and intact, covered with occlusive OP site dressing, ON-Q ball in situ DVT Evaluation: No evidence of DVT seen on physical exam.  Labs: Lab Results  Component Value Date   WBC 15.4 (H) 10/01/2024   HGB 9.8 (L) 10/01/2024   HCT 31.2 (L) 10/01/2024   MCV 78.4 (L) 10/01/2024   PLT 202 10/01/2024      Latest Ref Rng & Units 09/30/2024    7:41 AM  CMP  Glucose 70 - 99  mg/dL 89   BUN 6 - 20 mg/dL 8   Creatinine 9.55 - 8.99 mg/dL 9.31   Sodium 864 - 854 mmol/L 137   Potassium 3.5 - 5.1 mmol/L 4.5   Chloride 98 - 111 mmol/L 106   CO2 22 - 32 mmol/L 23   Calcium 8.9 - 10.3 mg/dL 8.2   Total Protein 6.5 - 8.1 g/dL 5.7   Total Bilirubin 0.0 - 1.2 mg/dL <9.7   Alkaline Phos 38 - 126 U/L 143   AST 15 - 41 U/L 22   ALT 0 - 44 U/L 8    Edinburgh Score:    09/30/2024    9:30 PM  Edinburgh Postnatal Depression Scale Screening Tool  I have been able to laugh and see the funny side of things. 0  I have looked forward with enjoyment to things. 0  I have blamed myself unnecessarily when things went wrong. 0  I have been anxious or worried for no good reason. 2  I have felt scared or panicky for no good reason. 1  Things have been getting on top of me. 0  I have been so unhappy that I have had difficulty sleeping. 0  I have felt sad or miserable. 0  I have been so unhappy that I have been crying. 0  The thought of harming myself has occurred to me. 0  Edinburgh Postnatal Depression Scale Total 3     Postpartum VTE Prophylaxis  Recommend 6 weeks of prophylactic anticoagulation with LMWH or subcutaneous unfractionated heparin if 1 or more high risk factor is present.  Recommend 14 days of prophylactic anticoagulation with LMWH or subcutaneous unfractionated heparin if 3 or more moderate risk factors are present.   Risk assessment for postpartum VTE and prophylactic treatment: High risk factors: None Moderate risk factors: Cesarean delivery   Postpartum VTE prophylaxis with LMWH not indicated    After visit meds:  Allergies as of 10/01/2024       Reactions   Toradol  [ketorolac  Tromethamine ] Rash   Zofran  [ondansetron ] Rash        Medication List     TAKE these medications    acetaminophen  500 MG tablet Commonly known as: TYLENOL  Take 2 tablets (1,000 mg total) by mouth every 6 (six) hours.   famotidine 40 MG tablet Commonly known as:  PEPCID Take 1 tablet (40 mg total) by mouth every evening.   ferrous sulfate  325 (65 FE) MG tablet Take 1 tablet (325 mg total) by mouth every Monday, Wednesday, and Friday.   ibuprofen  600 MG tablet Commonly known as: ADVIL  Take 1 tablet (600 mg total) by mouth every 6 (six) hours as needed for mild pain (pain score 1-3), moderate pain (pain score 4-6) or cramping.   multivitamin-prenatal 27-0.8 MG Tabs tablet Take 1 tablet by mouth daily at 12 noon.   oxyCODONE  5 MG immediate release tablet Commonly known as: Oxy IR/ROXICODONE  Take 1 tablet (5 mg total) by mouth every 6 (  six) hours as needed for severe pain (pain score 7-10) or breakthrough pain.   senna-docusate 8.6-50 MG tablet Commonly known as: Senokot-S Take 2 tablets by mouth daily as needed for mild constipation.   simethicone  80 MG chewable tablet Commonly known as: MYLICON Chew 1 tablet (80 mg total) by mouth 3 (three) times daily after meals.       Discharge home in stable condition Infant Feeding: Bottle Infant Disposition:home with mother Discharge instruction: per After Visit Summary and Postpartum booklet. Activity: Advance as tolerated. Pelvic rest for 6 weeks.  Diet: routine diet Anticipated Birth Control:  Contraceptives: IUD versus Nexplanon Postpartum Appointment:6 weeks Additional Postpartum F/U: Incision check 1 week Future Appointments:No future appointments. Follow up Visit:  Follow-up Information     Leonce Garnette BIRCH, MD. Schedule an appointment as soon as possible for a visit in 1 week(s).   Specialty: Obstetrics and Gynecology Why: post-op incision check Contact information: [address] [place] [address] [phone]         Dewane Shiner, DO. Go in 2 week(s).   Specialty: Cardiology Why: Appointment scheduled for 10/11/24 at 2:45 PM Contact information: [address] [place] [address] [phone]                 Plan:  Mattye Verdone was discharged to home in good condition. Follow-up appointment as directed.    Signed:  Therisa Pillow, CNM Certified Nurse Midwife [facility]

## 2024-10-01 NOTE — Plan of Care (Signed)
 Patient to be discharged home with family. (She is packing up her room and getting dressed) Discharge instructions, when to follow up, and prescriptions reviewed with patient.  Patient verbalized understanding. Patient will be escorted out by auxiliary.  (She will call RN when ready for discharge) Elyn Sharps, RN 10/01/24 @1153 

## 2024-10-01 NOTE — Progress Notes (Signed)
 Patient is waiting on her Mom to pick her up, patient states she is on the way. Elyn Sharps, RN 10/01/24@1309 

## 2024-10-01 NOTE — Progress Notes (Signed)
 Patient discharged home with family.  Patient escorted out by auxiliary.   Elyn Sharps, RN 10/01/24 @1350 

## 2024-12-15 ENCOUNTER — Other Ambulatory Visit: Payer: Self-pay | Admitting: Medical Genetics

## 2024-12-15 DIAGNOSIS — Z006 Encounter for examination for normal comparison and control in clinical research program: Secondary | ICD-10-CM
